# Patient Record
Sex: Male | Born: 1940
Health system: Southern US, Community
[De-identification: ages and names within clinical notes are randomized; demographics above are authoritative.]

## PROBLEM LIST (undated history)

## (undated) DIAGNOSIS — T7840XA Allergy, unspecified, initial encounter: Secondary | ICD-10-CM

## (undated) DIAGNOSIS — J45909 Unspecified asthma, uncomplicated: Secondary | ICD-10-CM

## (undated) HISTORY — PX: TONSILLECTOMY: SUR1361

## (undated) HISTORY — PX: HERNIA REPAIR: SHX51

## (undated) HISTORY — DX: Unspecified asthma, uncomplicated: J45.909

## (undated) HISTORY — DX: Allergy, unspecified, initial encounter: T78.40XA

## (undated) HISTORY — PX: DUPUYTREN / PALMAR FASCIOTOMY: SUR601

## (undated) HISTORY — PX: CATARACT EXTRACTION, BILATERAL: SHX1313

## (undated) HISTORY — PX: EYE SURGERY: SHX253

---

## 1987-07-23 HISTORY — PX: LUMBAR DISC SURGERY: SHX700

## 2003-06-09 ENCOUNTER — Encounter: Payer: Self-pay | Admitting: Internal Medicine

## 2005-10-22 ENCOUNTER — Ambulatory Visit: Payer: Self-pay | Admitting: Internal Medicine

## 2006-09-29 ENCOUNTER — Ambulatory Visit: Payer: Self-pay | Admitting: Internal Medicine

## 2006-09-30 ENCOUNTER — Ambulatory Visit: Payer: Self-pay | Admitting: Internal Medicine

## 2006-10-02 ENCOUNTER — Ambulatory Visit: Payer: Self-pay | Admitting: Cardiology

## 2006-10-31 ENCOUNTER — Ambulatory Visit: Payer: Self-pay

## 2006-11-04 ENCOUNTER — Encounter: Payer: Self-pay | Admitting: Internal Medicine

## 2006-11-28 ENCOUNTER — Ambulatory Visit: Payer: Self-pay | Admitting: Internal Medicine

## 2006-11-28 LAB — CONVERTED CEMR LAB
BUN: 16 mg/dL (ref 6–23)
Chloride: 107 meq/L (ref 96–112)
Eosinophils Absolute: 0.5 10*3/uL (ref 0.0–0.6)
Eosinophils Relative: 6.2 % — ABNORMAL HIGH (ref 0.0–5.0)
Hemoglobin: 15.1 g/dL (ref 13.0–17.0)
INR: 1 (ref 0.9–2.0)
MCV: 94 fL (ref 78.0–100.0)
Monocytes Relative: 11.9 % — ABNORMAL HIGH (ref 3.0–11.0)
Neutro Abs: 4.8 10*3/uL (ref 1.4–7.7)
Neutrophils Relative %: 61.8 % (ref 43.0–77.0)
Potassium: 4.4 meq/L (ref 3.5–5.1)
Sodium: 141 meq/L (ref 135–145)
WBC: 7.7 10*3/uL (ref 4.5–10.5)
aPTT: 23.8 s — ABNORMAL LOW (ref 26.5–36.5)

## 2006-12-02 ENCOUNTER — Ambulatory Visit: Payer: Self-pay

## 2006-12-02 ENCOUNTER — Encounter: Payer: Self-pay | Admitting: Cardiology

## 2006-12-03 ENCOUNTER — Ambulatory Visit (HOSPITAL_COMMUNITY): Admission: RE | Admit: 2006-12-03 | Discharge: 2006-12-03 | Payer: Self-pay | Admitting: Internal Medicine

## 2006-12-03 ENCOUNTER — Ambulatory Visit: Payer: Self-pay | Admitting: Internal Medicine

## 2008-03-17 ENCOUNTER — Ambulatory Visit: Payer: Self-pay | Admitting: Internal Medicine

## 2008-03-17 DIAGNOSIS — H900 Conductive hearing loss, bilateral: Secondary | ICD-10-CM

## 2008-03-17 DIAGNOSIS — R55 Syncope and collapse: Secondary | ICD-10-CM | POA: Insufficient documentation

## 2008-03-22 ENCOUNTER — Encounter (INDEPENDENT_AMBULATORY_CARE_PROVIDER_SITE_OTHER): Payer: Self-pay | Admitting: *Deleted

## 2008-03-24 ENCOUNTER — Encounter (INDEPENDENT_AMBULATORY_CARE_PROVIDER_SITE_OTHER): Payer: Self-pay | Admitting: *Deleted

## 2008-03-30 ENCOUNTER — Telehealth: Payer: Self-pay | Admitting: Internal Medicine

## 2008-04-01 ENCOUNTER — Ambulatory Visit: Payer: Self-pay | Admitting: Internal Medicine

## 2008-04-01 LAB — CONVERTED CEMR LAB: PSA: 1.24 ng/mL (ref 0.10–4.00)

## 2008-04-04 ENCOUNTER — Encounter (INDEPENDENT_AMBULATORY_CARE_PROVIDER_SITE_OTHER): Payer: Self-pay | Admitting: *Deleted

## 2008-05-25 ENCOUNTER — Ambulatory Visit: Payer: Self-pay | Admitting: Internal Medicine

## 2008-09-27 ENCOUNTER — Ambulatory Visit: Payer: Self-pay | Admitting: Internal Medicine

## 2008-09-28 ENCOUNTER — Encounter (INDEPENDENT_AMBULATORY_CARE_PROVIDER_SITE_OTHER): Payer: Self-pay | Admitting: *Deleted

## 2008-09-29 ENCOUNTER — Encounter: Payer: Self-pay | Admitting: Internal Medicine

## 2008-10-03 ENCOUNTER — Ambulatory Visit: Payer: Self-pay | Admitting: Internal Medicine

## 2008-10-03 DIAGNOSIS — R7309 Other abnormal glucose: Secondary | ICD-10-CM

## 2008-10-03 LAB — CONVERTED CEMR LAB: LDL Goal: 160 mg/dL
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
## 2009-05-22 ENCOUNTER — Ambulatory Visit: Payer: Self-pay | Admitting: Internal Medicine

## 2010-06-13 ENCOUNTER — Encounter: Payer: Self-pay | Admitting: Internal Medicine

## 2010-06-21 ENCOUNTER — Ambulatory Visit: Payer: Self-pay | Admitting: Internal Medicine

## 2010-06-21 DIAGNOSIS — J45909 Unspecified asthma, uncomplicated: Secondary | ICD-10-CM | POA: Insufficient documentation

## 2010-06-21 DIAGNOSIS — J309 Allergic rhinitis, unspecified: Secondary | ICD-10-CM | POA: Insufficient documentation

## 2010-06-25 LAB — CONVERTED CEMR LAB
Eosinophils Absolute: 0 10*3/uL (ref 0.0–0.7)
Eosinophils Relative: 0.2 % (ref 0.0–5.0)
Hgb A1c MFr Bld: 6.1 % (ref 4.6–6.5)
Lymphocytes Relative: 10.6 % — ABNORMAL LOW (ref 12.0–46.0)
Lymphs Abs: 1.3 10*3/uL (ref 0.7–4.0)
MCHC: 33.8 g/dL (ref 30.0–36.0)
Neutro Abs: 9.2 10*3/uL — ABNORMAL HIGH (ref 1.4–7.7)
Platelets: 383 10*3/uL (ref 150.0–400.0)

## 2010-07-19 ENCOUNTER — Ambulatory Visit: Payer: Self-pay | Admitting: Internal Medicine

## 2010-08-19 LAB — CONVERTED CEMR LAB
ALT: 31 units/L (ref 0–53)
Albumin: 4.3 g/dL (ref 3.5–5.2)
Alkaline Phosphatase: 63 units/L (ref 39–117)
BUN: 15 mg/dL (ref 6–23)
Basophils Absolute: 0 10*3/uL (ref 0.0–0.1)
CRP, High Sensitivity: <1 — ABNORMAL LOW (ref 0.00–5.00)
Cholesterol: 247 mg/dL (ref 0–200)
Creatinine, Ser: 0.9 mg/dL (ref 0.4–1.5)
Direct LDL: 139 mg/dL
Eosinophils Absolute: 0.4 10*3/uL (ref 0.0–0.7)
Glucose, Bld: 111 mg/dL — ABNORMAL HIGH (ref 70–99)
HCT: 47.2 % (ref 39.0–52.0)
HDL: 74.6 mg/dL (ref >39.0)
Hemoglobin: 15.9 g/dL (ref 13.0–17.0)
Lymphocytes Relative: 23.5 % (ref 12.0–46.0)
MCV: 99.1 fL (ref 78.0–100.0)
Monocytes Relative: 12.3 % — ABNORMAL HIGH (ref 3.0–12.0)
Neutro Abs: 3.8 10*3/uL (ref 1.4–7.7)
Neutrophils Relative %: 57.2 % (ref 43.0–77.0)
Platelets: 316 10*3/uL (ref 150–400)
Potassium: 5.1 meq/L (ref 3.5–5.1)
RBC: 4.76 M/uL (ref 4.22–5.81)
TSH: 1.01 microintl units/mL (ref 0.35–5.50)
Total Protein: 7.1 g/dL (ref 6.0–8.3)
WBC: 6.5 10*3/uL (ref 4.5–10.5)

## 2010-08-21 NOTE — Assessment & Plan Note (Signed)
Summary: TO TAKE ABOUT ASTHMA//PH   Vital Signs:  Patient profile:   70 year old male Weight:      204.6 pounds BMI:     27.85 Temp:     98.7 degrees F oral Pulse rate:   68 / minute Resp:     16 per minute BP sitting:   104 / 62  (left arm) Cuff size:   large  Vitals Entered By: Shonna Chock CMA (June 21, 2010 8:08 AM) CC: Follow-up visit: patient was seen at urgent care for allergy/asthma, COPD follow-up   CC:  Follow-up visit: patient was seen at urgent care for allergy/asthma and COPD follow-up.  History of Present Illness: RAD  Follow-Up      This is a 70 year old man who presents for  RAD  follow-up for which he was treated @ UC in [facility], [place] 06/13/2010. O2 sats were 94% on RA He received Prednisone, Ventolin MDI & antihistamine with significant improvement in extrinsic symptoms of SOB, wheezing, itchy eyes & sneezing. No PMH of asthma .Occasional PMH of seasonal allergic. He smoked from age 43 -28, < 1 ppd.  The patient  now denies shortness of breath, chest tightness, wheezing, cough, increased sputum, nocturnal awakening, and exercise induced symptoms.  The patient reports no limitation in activities.  Current treatment includes MDI without spacer twice a day .  He is on 8th of 10 days of Prednisone. No PMH of PNA. His  Pneumonia  vaccine & flu shot are up to date.  Current Medications (verified): 1)  Bayer Aspirin 325 Mg Tabs (Aspirin) .Marland Kitchen.. 1 By Mouth Once Daily With Breakfast 2)  Multivitamins  Tabs (Multiple Vitamin) .Marland Kitchen.. 1 By Mouth Once Daily 3)  Fiber .Marland Kitchen.. 1 By Mouth Once Daily 4)  Prednisone (Pak) 10 Mg Tabs (Prednisone) .... As Directed Per Pak (Rx'ed At Urgent Care) 5)  Ventolin Hfa 108 (90 Base) Mcg/act Aers (Albuterol Sulfate) .... As Needed (Rx'ed At Urgent Care) 6)  Diphenhydramine Hcl 25 Mg Tabs (Diphenhydramine Hcl) .Marland Kitchen.. 1 By Mouth Once Daily  Allergies (verified): No Known Drug Allergies  Past History:  Past Medical History: Syncope  09/28/06 Macular Degeneration , Dr Dione Booze Allergic rhinitis, seasonal  Physical Exam  General:  in no acute distress; alert,appropriate and cooperative throughout examination Ears:  External ear exam shows no significant lesions or deformities.  Otoscopic examination reveals clear canals, tympanic membranes are intact bilaterally without bulging, retraction, inflammation or discharge. Hearing is grossly normal bilaterally. Nose:  External nasal examination shows no deformity or inflammation. Nasal mucosa are pink and moist without lesions or exudates. Mouth:  Oral mucosa and oropharynx without lesions or exudates.  Teeth in good repair. Lungs:  Normal respiratory effort, chest expands symmetrically. Lungs are clear to auscultation, no crackles or wheezes. BS slightly decreased Heart:  regular rhythm, no gallop, no rub, no JVD, bradycardia, and grade 1 /6 systolic murmur.   Pulses:  R and L radial,dorsalis pedis and posterior tibial pulses are full and equal bilaterally Extremities:  No clubbing, cyanosis, edema. Neurologic:  alert & oriented X3.   Skin:  Intact without suspicious lesions or rashes Cervical Nodes:  No lymphadenopathy noted Axillary Nodes:  No palpable lymphadenopathy Psych:  normally interactive.     Impression & Recommendations:  Problem # 1:  REACTIVE AIRWAY DISEASE (ICD-493.90)  His updated medication list for this problem includes:    Prednisone (pak) 10 Mg Tabs (Prednisone) .Marland Kitchen... As directed per pak (rx'ed at urgent care)  Ventolin Hfa 108 (90 Base) Mcg/act Aers (Albuterol sulfate) .Marland Kitchen... 1-2 puffs every 4 hrs prn    Dulera 200-5 Mcg/act Aero (Mometasone furo-formoterol fum) .Marland Kitchen... 1-2 puffs every 12 hrs as needed  Orders: Venipuncture (60454) TLB-CBC Platelet - w/Differential (85025-CBCD)  Problem # 2:  ALLERGIC RHINITIS (ICD-477.9)  Orders: Venipuncture (09811) TLB-CBC Platelet - w/Differential (85025-CBCD)  His updated medication list for this problem  includes:    Loratadine 10 Mg Tabs (Loratadine) .Marland Kitchen... 1 once daily as needed for allergies  Problem # 3:  FASTING HYPERGLYCEMIA (ICD-790.29)  PMH of , on  steroid burst  Orders: Venipuncture (91478) TLB-A1C / Hgb A1C (Glycohemoglobin) (83036-A1C)  Complete Medication List: 1)  Bayer Aspirin 325 Mg Tabs (Aspirin) .Marland Kitchen.. 1 by mouth once daily with breakfast 2)  Multivitamins Tabs (Multiple vitamin) .Marland Kitchen.. 1 by mouth once daily 3)  Fiber  .Marland Kitchen.. 1 by mouth once daily 4)  Prednisone (pak) 10 Mg Tabs (Prednisone) .... As directed per pak (rx'ed at urgent care) 5)  Ventolin Hfa 108 (90 Base) Mcg/act Aers (Albuterol sulfate) .Marland Kitchen.. 1-2 puffs every 4 hrs prn 6)  Dulera 200-5 Mcg/act Aero (Mometasone furo-formoterol fum) .Marland Kitchen.. 1-2 puffs every 12 hrs as needed 7)  Loratadine 10 Mg Tabs (Loratadine) .Marland Kitchen.. 1 once daily as needed for allergies  Patient Instructions: 1)  Use Ventolin every 4 hrs as needed only . 2)  Please schedule a follow-up appointment in 1 month.We may consider further evaluation. Long term Singulair may be option for extrinsic cough. Prescriptions: LORATADINE 10 MG TABS (LORATADINE) 1 once daily as needed for allergies  #90 x 3   Entered and Authorized by:   Marga Melnick MD   Signed by:   Marga Melnick MD on 06/21/2010   Method used:   Print then Give to Patient   RxID:   [id number] DULERA 200-5 MCG/ACT AERO (MOMETASONE FURO-FORMOTEROL FUM) 1-2 puffs every 12 hrs as needed  #1 x 5   Entered and Authorized by:   Marga Melnick MD   Signed by:   Marga Melnick MD on 06/21/2010   Method used:   Print then Give to Patient   RxID:   [id number]    Orders Added: 1)  Est. Patient Level IV [72536] 2)  Venipuncture [64403] 3)  TLB-CBC Platelet - w/Differential [85025-CBCD] 4)  TLB-A1C / Hgb A1C (Glycohemoglobin) [83036-A1C]  Appended Document: TO TAKE ABOUT ASTHMA//PH

## 2010-08-21 NOTE — Progress Notes (Signed)
Summary: Med & Problem List Brought by Patient  Med & Problem List Brought by Patient   Imported By: Lanelle Bal 06/27/2010 13:05:04  _____________________________________________________________________  External Attachment:    Type:   Image     Comment:   External Document

## 2010-08-23 NOTE — Assessment & Plan Note (Signed)
Summary: rto 1 month/cbs   Vital Signs:  Patient profile:   71 year old male Weight:      206 pounds BMI:     28.04 Temp:     98.6 degrees F oral Pulse rate:   72 / minute Resp:     15 per minute BP sitting:   110 / 68  (left arm) Cuff size:   large  Vitals Entered By: Shonna Chock CMA (July 19, 2010 9:15 AM) CC: 1 month follow-up (patient states he feels better compared to 1 month ago) , COPD follow-up   CC:  1 month follow-up (patient states he feels better compared to 1 month ago)  and COPD follow-up.  History of Present Illness:      This is a 70 year old man who presents for  RAD  follow-up.  The patient reports  minor loose cough, but denies shortness of breath, chest tightness, wheezing, increased sputum, nocturnal awakening, and exercise induced symptoms.  The patient reports no limitation in activities.  Medication use includes controller med rarely, Dulera 2x/week.  Symptoms are worse with rhinitis. Dog hair may be trigger.    Current Medications (verified): 1)  Bayer Aspirin 325 Mg Tabs (Aspirin) .Marland Kitchen.. 1 By Mouth Once Daily With Breakfast 2)  Multivitamins  Tabs (Multiple Vitamin) .Marland Kitchen.. 1 By Mouth Once Daily 3)  Fiber .Marland Kitchen.. 1 By Mouth Once Daily 4)  Prednisone (Pak) 10 Mg Tabs (Prednisone) .... As Directed Per Pak (Rx'ed At Urgent Care) 5)  Ventolin Hfa 108 (90 Base) Mcg/act Aers (Albuterol Sulfate) .Marland Kitchen.. 1-2 Puffs Every 4 Hrs Prn 6)  Dulera 200-5 Mcg/act Aero (Mometasone Furo-Formoterol Fum) .Marland Kitchen.. 1-2 Puffs Every 12 Hrs As Needed 7)  Loratadine 10 Mg Tabs (Loratadine) .Marland Kitchen.. 1 Once Daily As Needed For Allergies  Allergies (verified): No Known Drug Allergies  Review of Systems Resp:  Denies chest pain with inspiration. Allergy:  Denies itching eyes and sneezing.  Physical Exam  General:  in no acute distress; alert,appropriate and cooperative throughout examination Ears:  External ear exam shows no significant lesions or deformities.  Otoscopic examination reveals  clear canals, tympanic membranes are intact bilaterally without bulging, retraction, inflammation or discharge. Hearing is grossly normal bilaterally. Nose:  External nasal examination shows no deformity or inflammation. Nasal mucosa are pink and moist without lesions or exudates. Mouth:  Oral mucosa and oropharynx without lesions or exudates.   Lungs:  Normal respiratory effort, chest expands symmetrically. Lungs are clear to auscultation, no crackles or wheezes. BS slightly decreased Heart:  Normal rate and regular rhythm. S1 and S2 normal without gallop, murmur, click, rub .S4 Cervical Nodes:  No lymphadenopathy noted Axillary Nodes:  No palpable lymphadenopathy   Impression & Recommendations:  Problem # 1:  REACTIVE AIRWAY DISEASE (ICD-493.90) minimal residual  NP  cough His updated medication list for this problem includes:    Prednisone (pak) 10 Mg Tabs (Prednisone) .Marland Kitchen... As directed per pak (rx'ed at urgent care)    Ventolin Hfa 108 (90 Base) Mcg/act Aers (Albuterol sulfate) .Marland Kitchen... 1-2 puffs every 4 hrs prn    Dulera 200-5 Mcg/act Aero (Mometasone furo-formoterol fum) .Marland Kitchen... 1-2 puffs every 12 hrs as needed    Singulair 10 Mg Tabs (Montelukast sodium) .Marland Kitchen... 1 once daily as needed for allergic cough  Problem # 2:  ALLERGIC RHINITIS (ICD-477.9) controlled with Loratidine His updated medication list for this problem includes:    Loratadine 10 Mg Tabs (Loratadine) .Marland Kitchen... 1 once daily as needed for allergies  Complete  Medication List: 1)  Bayer Aspirin 325 Mg Tabs (Aspirin) .Marland Kitchen.. 1 by mouth once daily with breakfast 2)  Multivitamins Tabs (Multiple vitamin) .Marland Kitchen.. 1 by mouth once daily 3)  Fiber  .Marland Kitchen.. 1 by mouth once daily 4)  Prednisone (pak) 10 Mg Tabs (Prednisone) .... As directed per pak (rx'ed at urgent care) 5)  Ventolin Hfa 108 (90 Base) Mcg/act Aers (Albuterol sulfate) .Marland Kitchen.. 1-2 puffs every 4 hrs prn 6)  Dulera 200-5 Mcg/act Aero (Mometasone furo-formoterol fum) .Marland Kitchen.. 1-2 puffs every  12 hrs as needed 7)  Loratadine 10 Mg Tabs (Loratadine) .Marland Kitchen.. 1 once daily as needed for allergies 8)  Singulair 10 Mg Tabs (Montelukast sodium) .Marland Kitchen.. 1 once daily as needed for allergic cough  Patient Instructions: 1)  Singulair once daily as needed for cough unresponsive to Loratidine. Prescriptions: SINGULAIR 10 MG TABS (MONTELUKAST SODIUM) 1 once daily as needed for allergic cough  #30 x 5   Entered and Authorized by:   Marga Melnick MD   Signed by:   Marga Melnick MD on 07/19/2010   Method used:   Print then Give to Patient   RxID:   [id number]    Orders Added: 1)  Est. Patient Level III [14782]
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
## 2010-12-04 NOTE — Op Note (Signed)
NAME:  Jeremy Li, Jeremy Li                 ACCOUNT NO.:  [id number]   MEDICAL RECORD NO.:  [id number]          PATIENT TYPE:  OIB   LOCATION:  2855                         FACILITY:  [facility]   PHYSICIAN:  Doylene Canning. Ladona Ridgel, MD    DATE OF BIRTH:  September 22, 1940   DATE OF PROCEDURE:  12/03/2006  DATE OF DISCHARGE:  12/03/2006                               OPERATIVE REPORT   PROCEDURE PERFORMED:  Head-up tilt table test.   INDICATIONS:  Unexplained syncope.   INTRODUCTION:  The patient is a very pleasant 70 year old male with  unexplained syncope and PVCs who is referred for head-up tilt table  test.   PROCEDURE:  After informed obtained, the patient taken diagnostic EP lab  in fasting state.  Usual preparation was placed in the supine position  where his initial heart rate was in the mid 50s in his blood pressure  was 130/75.  He was placed in the 70 degree head up tilt table position  for the next 45 minutes.  There during this time period, the heart rate  remained in the low to mid 60s.  His maximum heart rate increased up to  78 beats per minute.  His blood pressure initially was in the 140 range  and over the next 45 minutes gradually decreased down to the 110 range  but with a nadir of 102 mmHg.  Despite this the patient had no inducible  syncope and no symptoms.  He was subsequently returned back to the  supine position and then back to his room in satisfactory condition.   COMPLICATIONS:  There were no immediate procedure complications.   RESULTS:  Demonstrates a negative head-up tilt table testing a patient  with unexplained syncope.      Doylene Canning. Ladona Ridgel, MD  Electronically Signed     GWT/MEDQ  D:  04/16/2007  T:  04/17/2007  Job:  [id number]
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
## 2011-03-22 ENCOUNTER — Ambulatory Visit (HOSPITAL_BASED_OUTPATIENT_CLINIC_OR_DEPARTMENT_OTHER)
Admission: RE | Admit: 2011-03-22 | Discharge: 2011-03-22 | Disposition: A | Discharge status: Home / Self Care | Payer: Medicare Other | Source: Ambulatory Visit | Attending: Orthopedic Surgery | Admitting: Orthopedic Surgery

## 2011-03-22 DIAGNOSIS — M6580 Other synovitis and tenosynovitis, unspecified site: Secondary | ICD-10-CM | POA: Insufficient documentation

## 2011-03-22 DIAGNOSIS — L923 Foreign body granuloma of the skin and subcutaneous tissue: Secondary | ICD-10-CM | POA: Insufficient documentation

## 2011-03-26 NOTE — H&P (Signed)
NAMETIANT, PEIXOTO                 ACCOUNT NO.:  [id number]  MEDICAL RECORD NO.:  [id number]  LOCATION:                                 FACILITY:  PHYSICIAN:  Katy Fitch. Priti Consoli, M.D. DATE OF BIRTH:  1941/03/31  DATE OF ADMISSION: DATE OF DISCHARGE:                             HISTORY & PHYSICAL   PREOPERATIVE DIAGNOSES: 1. Chronic stenosing tenosynovitis, left long finger at A1 pulley. 2. Retained thorn foreign body with development of granuloma, dorsal     aspect of left small finger, metacarpophalangeal joint, rule out     intra-articular placement of thorn.  POSTOPERATIVE DIAGNOSIS:  Chronic left long finger stenosing tenosynovitis and intra-articular thorn with foreign body granuloma.  OPERATION: 1. Release of left long finger A1 pulley and A0 pulley. 2. Resection of thorn foreign body granuloma with debridement of the     granulation tissue from metacarpophalangeal joint capsule.  OPERATING SURGEON:  Katy Fitch. Cynithia Hakimi, MD  ASSISTANT:  Marveen Reeks Dasnoit, PA-C  ANESTHESIA:  Lidocaine 2% palmar block of left long finger and flexor sheath and dorsal field block, left small finger metacarpophalangeal joint.  ANESTHETIST:  Katy Fitch. Agata Lucente, MD  This was a minor operating room procedure.  PROCEDURE:  Renee Beale was brought to room 1 of the [facility] and placed in supine position on the operating table.  Following informed consent and an alcohol and Betadine prep, 2% lidocaine was infiltrated in the path of the intended incisions in the palm, the flexor sheath, the left long finger and proximal to the metacarpal head of the left small finger.  The left arm and hand were then prepped with Betadine soap and solution and sterilely draped.  A pneumatic tourniquet was applied to proximal left brachium.  Following exsanguination of left arm by direct compression, the arterial tourniquet was inflated to 220 mmHg.  The procedure commenced with an oblique incision  just distal to the distal palmar crease.  Subcutaneous tissues were carefully divided releasing contracted palmar fascia.  The A1 pulley was identified and split with scalpel and scissors.  Mr. Woldt had some palmar fibromatosis and a tight A0 pulley.  This was likewise dissected and released.  Thereafter, he demonstrated full active range of motion of his left long finger without triggering.  This wound was then repaired with intradermal 3-0 Prolene suture.  Attention was then directed to the dorsal aspect of the left small finger MP joint region.  A short longitudinal incision was fashioned over the palpably enlarged area.  Subcutaneous tissues were carefully divided revealing a granuloma pointing through the extensor mechanism. This was incised longitudinally and, with gentle spreading, a 3/8-inch long thorn was removed.  We carefully curetted the granuloma.  No other thorn parts were identified.  We could not see signs of other foreign body.  However, due to the articular nature of this injury, it is possible that Mr. Bays has a small fragment of thorn in the palmar aspect of the joint.  Should he have a retained fragment, he would have continued synovitis.  The wound was then curetted, followed by repair of the skin with intradermal 3-0 Prolene.  For aftercare, Mr. Thoma  is placed on Vicodin 5 mg 1 p.o. q.4-6 hours p.r.n. pain, 20 tablets without refill.  Also, doxycycline 100 mg p.o. b.i.d. x4 days as prophylactic antibiotic.  He will return to see [place] in followup in a week for suture removal.  He will contact us if he has any problems with his wounds.  I have told him to remove the dressing placed in the OR in 3 days and advance to Band- Aids on both wounds.  He will contact our office sooner p.r.n. problems.     Katy Fitch Josselin Gaulin, M.D.     RVS/MEDQ  D:  03/22/2011  T:  03/22/2011  Job:  [id number]  cc:   Titus Dubin. Alwyn Ren, MD,FACP,FCCP  Electronically Signed by  Josephine Igo M.D. on 03/26/2011 09:55:07 AM

## 2012-07-27 ENCOUNTER — Ambulatory Visit (INDEPENDENT_AMBULATORY_CARE_PROVIDER_SITE_OTHER): Payer: Medicare Other | Admitting: Internal Medicine

## 2012-07-27 ENCOUNTER — Encounter: Payer: Self-pay | Admitting: Internal Medicine

## 2012-07-27 VITALS — BP 124/76 | HR 88 | Temp 98.7°F | Wt 203.0 lb

## 2012-07-27 DIAGNOSIS — J309 Allergic rhinitis, unspecified: Secondary | ICD-10-CM

## 2012-07-27 DIAGNOSIS — R55 Syncope and collapse: Secondary | ICD-10-CM

## 2012-07-27 DIAGNOSIS — J45909 Unspecified asthma, uncomplicated: Secondary | ICD-10-CM

## 2012-07-27 MED ORDER — ALBUTEROL SULFATE HFA 108 (90 BASE) MCG/ACT IN AERS: 2.0000 | INHALATION_SPRAY | RESPIRATORY_TRACT | Status: DC | PRN

## 2012-07-27 MED ORDER — FLUTICASONE PROPIONATE 50 MCG/ACT NA SUSP: 1.0000 | Freq: Two times a day (BID) | NASAL | Status: DC | PRN

## 2012-07-27 NOTE — Progress Notes (Signed)
  Subjective:    Patient ID: Jeremy Li, male    DOB: [birth year], 72 y.o.   MRN: [id number]  HPI  07/15/12 he experienced acute onset of dyspnea while essentially sedentary; this was in the context of a family visit to [place] with exposure to dogs. The events were similar to those 07/15/10.  The present symptoms associated with cough with white sputum.  He also had itchy, watery eyes and sneezing. Symptoms improved with a Benadryl equivalent and use of leftover albuterol. He is back to his baseline.   Review of Systems He denies associated fever, chills, or sweats. He's had no purulent sputum. He also denies edema, claudication, palpitations, chest pain, or  paroxysmal nocturnal dyspnea.  He has a history of right no conjunctivitis related to seasonal allergies as well as remotely with Exposure. He has no history of asthma and is no family history of pulmonary disease.  His smoking history is minimal.     Objective:   Physical Exam General appearance:good health ;well nourished; no acute distress or increased work of breathing is present.  No  lymphadenopathy about the head, neck, or axilla noted.   Eyes: No conjunctival inflammation or lid edema is present.   Ears:  External ear exam shows no significant lesions or deformities.  Otoscopic examination reveals clear canals, tympanic membranes are intact bilaterally without bulging, retraction, inflammation or discharge.  Nose:  External nasal examination shows no deformity or inflammation. Nasal mucosa are pink and moist without lesions or exudates. No septal dislocation or deviation.No obstruction to airflow.   Oral exam: Dental hygiene is good; lips and gums are healthy appearing.There is no oropharyngeal erythema or exudate noted.   Neck:  No deformities, thyromegaly, masses, or tenderness noted.   No NVD Heart:  Normal rate and regular rhythm. S1 and S2 normal without gallop, murmur, click, rub .S4 Lungs:Chest clear to  auscultation; no wheezes, rhonchi,rales ,or rubs present.No increased work of breathing.  Decreased BS  Bowel sounds are normal. Abdomen is soft and nontender with no organomegaly, hernias  or masses. No HJR  Extremities:  No cyanosis, edema, or clubbing  noted . Minor DIP changes   All pulses intact without  bruits .No ischemic skin changes.     Skin: Warm & dry           Assessment & Plan:

## 2012-07-27 NOTE — Assessment & Plan Note (Signed)
Albuterol 1-2 sprays every 4-6 hours as needed. If the rescue agent is needed more than 2-3 times per week; maintenance agent such as Dulera would be appropriate

## 2012-07-27 NOTE — Assessment & Plan Note (Signed)
Fluticasone 1 spray in each nostril twice a day as needed. Use the "crossover" technique as discussed

## 2012-07-27 NOTE — Patient Instructions (Addendum)
Plain Mucinex for thick secretions ;force NON dairy fluids . Use a Neti pot daily as needed for sinus congestion; going from open side to congested side . Nasal cleansing in the shower as discussed. Make sure that all residual soap is removed to prevent irritation. Fluticasone 1 spray in each nostril twice a day as needed. Use the "crossover" technique as discussed. Plain Allegra 160 daily or Loratidine 10 mg as needed for itchy eyes & sneezing.  If you activate My Chart; the results can be released to you as soon as they populate from the lab. If you choose not to use this program; the labs have to be reviewed, copied & mailed   causing a delay in getting the results to you.

## 2012-09-28 ENCOUNTER — Ambulatory Visit (INDEPENDENT_AMBULATORY_CARE_PROVIDER_SITE_OTHER): Payer: Medicare Other | Admitting: Internal Medicine

## 2012-09-28 ENCOUNTER — Encounter: Payer: Self-pay | Admitting: Internal Medicine

## 2012-09-28 VITALS — BP 124/78 | HR 78 | Temp 98.6°F | Resp 12 | Ht 72.5 in | Wt 204.0 lb

## 2012-09-28 DIAGNOSIS — J45909 Unspecified asthma, uncomplicated: Secondary | ICD-10-CM

## 2012-09-28 DIAGNOSIS — R7309 Other abnormal glucose: Secondary | ICD-10-CM

## 2012-09-28 DIAGNOSIS — J309 Allergic rhinitis, unspecified: Secondary | ICD-10-CM

## 2012-09-28 DIAGNOSIS — E785 Hyperlipidemia, unspecified: Secondary | ICD-10-CM

## 2012-09-28 DIAGNOSIS — Z0000 Encounter for general adult medical examination without abnormal findings: Secondary | ICD-10-CM

## 2012-09-28 NOTE — Patient Instructions (Addendum)
Preventive Health Care: Exercise at least 30-45 minutes a day,  3-4 days a week.  Eat a low-fat diet with lots of fruits and vegetables, up to 7-9 servings per day. This would eliminate the need for vitamin supplements. Consume less than 40 grams of sugar (preferably ZERO) per day from foods & drinks with High Fructose Corn Sugar as #1,2,3 or # 4 on label. Please  schedule fasting Labs : BMET,Lipids, hepatic panel,  TSH, A1c.PLEASE BRING THESE INSTRUCTIONS TO FOLLOW UP  LAB APPOINTMENT.This will guarantee correct labs are drawn, eliminating need for repeat blood sampling ( needle sticks ! ). Diagnoses /Codes: 272.4, 790.29.

## 2012-09-28 NOTE — Progress Notes (Signed)
Subjective:    Patient ID: Jeremy Li, male    DOB: 1940-08-20, 72 y.o.   MRN: [id number]  HPI Medicare Wellness Visit:  Psychosocial & medical history were reviewed as required by Medicare (abuse,antisocial behavioral risks,firearm risk).  Social history: caffeine: 2 cups coffee/day , alcohol: 2 small glasses wine/day  ,  tobacco use: quit 2008   Exercise : walking 1/2 mpd No home & personal  safety / fall risk Activities of daily living: no limitations  Seatbelt  and smoke alarm employed. Power of Attorney/Living Will status : in place Ophthalmology exam current (Dr Christophe Louis) Hearing evaluation current Orientation :oriented X 3  Memory & recall :good Spelling  testing:good Mood & affect : normal . Depression / anxiety: denied Travel history : last [place] 1981 Immunization status :Shingles  needed Transfusion history:  none  Preventive health surveillance ( colonoscopy as per protocol/ [facility]): see below Dental care:  Every 12 mos. Chart reviewed &  Updated. Active issues reviewed & addressed.      Review of Systems HYPERLIPIDEMIA: Diet- heart healthy Chest pain, palpitations- no       Dyspnea- only with extrinsic RAD flare Lightheadedness,Syncope- no Edema- no Muscle aches- no   PMH of HYPERGLYCEMIA: Polyuria/phagia/dipsia- no      Visual problems- no   Abd pain, bowel changes- no but no colonoscopy to date;because he is worried about risk of procedureSOC discussed               Objective:   Physical Exam Gen.:  well-nourished in appearance. Alert, appropriate and cooperative throughout exam. Head: Normocephalic without obvious abnormalities; pattern alopecia  Eyes: No corneal or conjunctival inflammation noted. Ears: External  ear exam reveals no significant lesions or deformities. Canals clear .TMs normal. Hearing is grossly normal bilaterally. Nose: External nasal exam reveals no deformity or inflammation. Nasal mucosa are pink and moist. No lesions  or exudates noted.   Mouth: Oral mucosa and oropharynx reveal no lesions or exudates. Teeth in good repair. Neck: No deformities, masses, or tenderness noted. Range of motion & Thyroid normal. Lungs: Normal respiratory effort; chest expands symmetrically. Lungs are clear to auscultation without rales, wheezes, or increased work of breathing. Heart: Normal rate and rhythm. Normal S1 and S2. No gallop, click, or rub. Grade 1/2 over 6 systolic murmur. Abdomen: Bowel sounds normal; abdomen soft and nontender. No masses or organomegaly . Ventral & umbilical  hernia noted. Genitalia: DRE declined                            Musculoskeletal/extremities: There is some asymmetry of the posterior thoracic musculature suggesting occult scoliosis. No clubbing, cyanosis, edema, or significant extremity  deformity noted. Range of motion normal .Tone & strength  Normal. Joints reveal mild flexion 5th finger changes. Nail health good.Residual Dupuytren's changes. Able to lie down & sit up w/o help. Negative SLR bilaterally. Crepitus L knee w/o effusion. Vascular: Carotid, radial artery, dorsalis pedis and  posterior tibial pulses are full and equal. No bruits present. Neurologic: Alert and oriented x3. Deep tendon reflexes symmetrical and normal.      Skin: Intact without suspicious lesions or rashes. Lymph: No cervical, axillary, or inguinal lymphadenopathy present. Psych: Mood and affect are normal. Normally interactive  Assessment & Plan:  #1 Medicare Wellness Exam; criteria met ; data entered #2 Problem List reviewed ; Assessment/ Recommendations made Plan: see Orders

## 2012-09-30 ENCOUNTER — Other Ambulatory Visit (INDEPENDENT_AMBULATORY_CARE_PROVIDER_SITE_OTHER): Payer: Medicare Other

## 2012-09-30 DIAGNOSIS — E785 Hyperlipidemia, unspecified: Secondary | ICD-10-CM

## 2012-09-30 LAB — BASIC METABOLIC PANEL
BUN: 16 mg/dL (ref 6–23)
Creatinine, Ser: 0.9 mg/dL (ref 0.4–1.5)
GFR: 92.54 mL/min (ref >60.00)
Potassium: 4.1 mEq/L (ref 3.5–5.1)

## 2012-09-30 LAB — LIPID PANEL
Cholesterol: 194 mg/dL (ref 0–200)
Triglycerides: 57 mg/dL (ref 0.0–149.0)

## 2012-09-30 LAB — HEPATIC FUNCTION PANEL
ALT: 21 U/L (ref 0–53)
Alkaline Phosphatase: 57 U/L (ref 39–117)
Bilirubin, Direct: 0 mg/dL (ref 0.0–0.3)
Total Protein: 6.6 g/dL (ref 6.0–8.3)

## 2012-11-13 ENCOUNTER — Other Ambulatory Visit: Payer: Self-pay | Admitting: Internal Medicine

## 2012-11-13 NOTE — Telephone Encounter (Signed)
Med filled.  

## 2012-11-28 ENCOUNTER — Other Ambulatory Visit: Payer: Self-pay | Admitting: Internal Medicine

## 2013-02-24 ENCOUNTER — Other Ambulatory Visit: Payer: Self-pay

## 2013-03-27 ENCOUNTER — Other Ambulatory Visit: Payer: Self-pay | Admitting: Internal Medicine

## 2013-03-31 NOTE — Telephone Encounter (Signed)
Rx sent to the pharmacy by e-script.//AB/CMA 

## 2013-05-27 ENCOUNTER — Other Ambulatory Visit: Payer: Self-pay

## 2013-08-04 ENCOUNTER — Encounter: Payer: Self-pay | Admitting: Internal Medicine

## 2013-08-16 ENCOUNTER — Other Ambulatory Visit: Payer: Self-pay | Admitting: Internal Medicine

## 2013-08-16 NOTE — Telephone Encounter (Signed)
Fluticasone refilled per protocol. JG//CMA

## 2013-09-01 ENCOUNTER — Other Ambulatory Visit: Payer: Self-pay | Admitting: Internal Medicine

## 2013-09-01 NOTE — Telephone Encounter (Signed)
Rx sent to the pharmacy by e-script.//AB/CMA 

## 2013-11-29 ENCOUNTER — Encounter: Payer: Self-pay | Admitting: Internal Medicine

## 2013-12-17 ENCOUNTER — Other Ambulatory Visit: Payer: Self-pay | Admitting: Internal Medicine

## 2014-02-28 ENCOUNTER — Encounter: Payer: Self-pay | Admitting: Internal Medicine

## 2014-02-28 ENCOUNTER — Ambulatory Visit (INDEPENDENT_AMBULATORY_CARE_PROVIDER_SITE_OTHER): Payer: Medicare Other | Admitting: Internal Medicine

## 2014-02-28 VITALS — BP 128/74 | HR 69 | Temp 98.3°F | Wt 206.5 lb

## 2014-02-28 DIAGNOSIS — J45909 Unspecified asthma, uncomplicated: Secondary | ICD-10-CM

## 2014-02-28 MED ORDER — MONTELUKAST SODIUM 10 MG PO TABS: 10.0000 mg | ORAL_TABLET | Freq: Every day | ORAL | Status: DC

## 2014-02-28 NOTE — Progress Notes (Signed)
Pre visit review using our clinic review tool, if applicable. No additional management support is needed unless otherwise documented below in the visit note. 

## 2014-02-28 NOTE — Patient Instructions (Addendum)
Shingles immunization would be indicated even without past history of chicken pox . It cannot be taken by individuals who are immunocompromised due to HIV or due to active chemotherapy. Please verify with your insurance company  which location would be least cost to you for its administration.

## 2014-02-28 NOTE — Assessment & Plan Note (Signed)
Trial of Singulair 

## 2014-02-28 NOTE — Progress Notes (Signed)
   Subjective:    Patient ID: Jeremy Li, male    DOB: 1941/05/05, 73 y.o.   MRN: [id number]  HPI  He has extrinsic symptoms of itchy, watery eyes if around his dog. He's been using loratadine with good control of the symptoms. He also employs nasal hygiene.  If he actually pets the dog he will develop reactive airways symptoms for which he has to use Ventolin up to 4 X a day    Review of Systems  He is also questioning whether or not to take the shingles vaccine. He is unsure as to whether he's ever had chickenpox.  The literature in Up to Date was reviewed. Whether someone has had chickenpox or not is irrelevant as the history is unreliable according to literature and even with prior chickenpox the immune protection due to antibodies may wane with age.  It's interesting that his wife was part of zoster vaccine study and actually received the vaccine although she had never had chickenpox. She has had no complications      Objective:   Physical Exam General appearance:good health ;well nourished; no acute distress or increased work of breathing is present.  No  lymphadenopathy about the head, neck, or axilla noted.   Eyes: No conjunctival inflammation or lid edema is present. There is no scleral icterus.Ptosis OS > OD  Ears:  External ear exam shows no significant lesions or deformities.  Otoscopic examination reveals clear canals, tympanic membranes are intact bilaterally without bulging, retraction, inflammation or discharge.  Nose:  External nasal examination shows no deformity or inflammation. Nasal mucosa are pink and moist without lesions or exudates. No septal dislocation or deviation.No obstruction to airflow.   Oral exam: lips and gums are healthy appearing.There is no oropharyngeal erythema or exudate noted.   Neck:  No deformities, thyromegaly, masses, or tenderness noted.   Supple with full range of motion without pain.   Heart:  Slow regular  rate and rhythm. S1 and  S2 normal without gallop, murmur, click, rub or other extra sounds.   Lungs:Chest clear to auscultation; no wheezes, rhonchi,rales ,or rubs present.No increased work of breathing.    Extremities:  No cyanosis, edema, or clubbing  noted    Skin: Warm & dry w/o jaundice or tenting.         Assessment & Plan:  See Current Assessment & Plan in Problem List under specific Diagnosis

## 2014-03-04 ENCOUNTER — Encounter: Payer: Self-pay | Admitting: Internal Medicine

## 2014-03-26 ENCOUNTER — Other Ambulatory Visit: Payer: Self-pay | Admitting: Internal Medicine

## 2014-03-29 ENCOUNTER — Encounter: Payer: Self-pay | Admitting: Internal Medicine

## 2014-05-02 ENCOUNTER — Telehealth: Payer: Self-pay | Admitting: Internal Medicine

## 2014-05-02 NOTE — Telephone Encounter (Signed)
Chart has been updated.

## 2014-05-02 NOTE — Telephone Encounter (Signed)
Pt called request for our office to update on his records for the injection that he got.   Dr. Linna Darner, I have done both Flu vaccine and Pneumonia vaccine at [facility], [place], on 04/21/2014. Influenza seasonal high dose PFS, 0.43ml Prevnar 13, 0.4ml Would you arrange to have this added to preventative care chart. I would do this myself, but do not see a method to add changes. Thank you for your help.

## 2014-08-22 ENCOUNTER — Other Ambulatory Visit: Payer: Self-pay | Admitting: Internal Medicine

## 2014-09-01 ENCOUNTER — Other Ambulatory Visit: Payer: Self-pay | Admitting: Internal Medicine

## 2014-09-21 ENCOUNTER — Other Ambulatory Visit: Payer: Self-pay | Admitting: Internal Medicine

## 2014-09-23 ENCOUNTER — Other Ambulatory Visit: Payer: Self-pay | Admitting: Internal Medicine

## 2014-10-30 ENCOUNTER — Other Ambulatory Visit: Payer: Self-pay | Admitting: Internal Medicine

## 2015-03-17 ENCOUNTER — Other Ambulatory Visit: Payer: Self-pay | Admitting: Emergency Medicine

## 2015-03-17 ENCOUNTER — Other Ambulatory Visit: Payer: Self-pay | Admitting: Internal Medicine

## 2015-03-17 MED ORDER — LORATADINE 10 MG PO TABS: ORAL_TABLET | ORAL | Status: DC

## 2015-03-20 ENCOUNTER — Other Ambulatory Visit: Payer: Self-pay | Admitting: Internal Medicine

## 2015-03-21 ENCOUNTER — Encounter: Payer: Self-pay | Admitting: Internal Medicine

## 2015-03-30 ENCOUNTER — Other Ambulatory Visit: Payer: Self-pay | Admitting: Internal Medicine

## 2015-05-01 ENCOUNTER — Other Ambulatory Visit: Payer: Self-pay | Admitting: Internal Medicine

## 2015-05-02 ENCOUNTER — Other Ambulatory Visit: Payer: Self-pay | Admitting: Internal Medicine

## 2015-05-12 ENCOUNTER — Ambulatory Visit (INDEPENDENT_AMBULATORY_CARE_PROVIDER_SITE_OTHER): Payer: Medicare Other | Admitting: Internal Medicine

## 2015-05-12 ENCOUNTER — Other Ambulatory Visit (INDEPENDENT_AMBULATORY_CARE_PROVIDER_SITE_OTHER): Payer: Medicare Other

## 2015-05-12 ENCOUNTER — Encounter: Payer: Self-pay | Admitting: Internal Medicine

## 2015-05-12 VITALS — BP 128/80 | HR 68 | Temp 98.6°F | Ht 72.5 in | Wt 206.0 lb

## 2015-05-12 DIAGNOSIS — IMO0002 Reserved for concepts with insufficient information to code with codable children: Secondary | ICD-10-CM

## 2015-05-12 DIAGNOSIS — E785 Hyperlipidemia, unspecified: Secondary | ICD-10-CM | POA: Diagnosis not present

## 2015-05-12 DIAGNOSIS — Z0000 Encounter for general adult medical examination without abnormal findings: Secondary | ICD-10-CM

## 2015-05-12 DIAGNOSIS — J4520 Mild intermittent asthma, uncomplicated: Secondary | ICD-10-CM | POA: Diagnosis not present

## 2015-05-12 DIAGNOSIS — J3081 Allergic rhinitis due to animal (cat) (dog) hair and dander: Secondary | ICD-10-CM

## 2015-05-12 DIAGNOSIS — R7309 Other abnormal glucose: Secondary | ICD-10-CM | POA: Diagnosis not present

## 2015-05-12 LAB — HEPATIC FUNCTION PANEL
ALBUMIN: 4.4 g/dL (ref 3.5–5.2)
ALK PHOS: 65 U/L (ref 39–117)
ALT: 25 U/L (ref 0–53)
AST: 19 U/L (ref 0–37)
Bilirubin, Direct: 0.1 mg/dL (ref 0.0–0.3)
TOTAL PROTEIN: 6.9 g/dL (ref 6.0–8.3)
Total Bilirubin: 0.6 mg/dL (ref 0.2–1.2)

## 2015-05-12 LAB — BASIC METABOLIC PANEL
BUN: 16 mg/dL (ref 6–23)
CALCIUM: 9.9 mg/dL (ref 8.4–10.5)
CO2: 29 meq/L (ref 19–32)
Chloride: 104 mEq/L (ref 96–112)
Creatinine, Ser: 0.98 mg/dL (ref 0.40–1.50)
GFR: 79.02 mL/min (ref >60.00)
Glucose, Bld: 80 mg/dL (ref 70–99)
POTASSIUM: 5.8 meq/L — AB (ref 3.5–5.1)
SODIUM: 140 meq/L (ref 135–145)

## 2015-05-12 LAB — LIPID PANEL
Cholesterol: 213 mg/dL — ABNORMAL HIGH (ref 0–200)
HDL: 86.2 mg/dL (ref >39.00)
LDL Cholesterol: 114 mg/dL — ABNORMAL HIGH (ref 0–99)
NONHDL: 126.46
Total CHOL/HDL Ratio: 2
Triglycerides: 60 mg/dL (ref 0.0–149.0)
VLDL: 12 mg/dL (ref 0.0–40.0)

## 2015-05-12 LAB — CBC WITH DIFFERENTIAL/PLATELET
BASOS ABS: 0.1 10*3/uL (ref 0.0–0.1)
Basophils Relative: 0.7 % (ref 0.0–3.0)
EOS PCT: 3.2 % (ref 0.0–5.0)
Eosinophils Absolute: 0.3 10*3/uL (ref 0.0–0.7)
HEMATOCRIT: 49 % (ref 39.0–52.0)
Hemoglobin: 16.3 g/dL (ref 13.0–17.0)
LYMPHS PCT: 16.5 % (ref 12.0–46.0)
Lymphs Abs: 1.5 10*3/uL (ref 0.7–4.0)
MCHC: 33.2 g/dL (ref 30.0–36.0)
MCV: 96.7 fl (ref 78.0–100.0)
MONOS PCT: 11 % (ref 3.0–12.0)
Monocytes Absolute: 1 10*3/uL (ref 0.1–1.0)
NEUTROS ABS: 6.3 10*3/uL (ref 1.4–7.7)
Neutrophils Relative %: 68.6 % (ref 43.0–77.0)
PLATELETS: 341 10*3/uL (ref 150.0–400.0)
RBC: 5.07 Mil/uL (ref 4.22–5.81)
RDW: 14 % (ref 11.5–15.5)
WBC: 9.1 10*3/uL (ref 4.0–10.5)

## 2015-05-12 LAB — PSA: PSA: 3.82 ng/mL (ref 0.10–4.00)

## 2015-05-12 LAB — TSH: TSH: 1.09 u[IU]/mL (ref 0.35–4.50)

## 2015-05-12 MED ORDER — LORATADINE 10 MG PO TABS: 10.0000 mg | ORAL_TABLET | Freq: Every day | ORAL | Status: DC

## 2015-05-12 MED ORDER — MONTELUKAST SODIUM 10 MG PO TABS: 10.0000 mg | ORAL_TABLET | Freq: Every day | ORAL | Status: DC

## 2015-05-12 MED ORDER — ALBUTEROL SULFATE HFA 108 (90 BASE) MCG/ACT IN AERS: 2.0000 | INHALATION_SPRAY | RESPIRATORY_TRACT | Status: DC | PRN

## 2015-05-12 MED ORDER — FLUTICASONE PROPIONATE 50 MCG/ACT NA SUSP: 1.0000 | Freq: Every day | NASAL | Status: DC

## 2015-05-12 NOTE — Assessment & Plan Note (Signed)
Lipids, LFTs, TSH ,BMET

## 2015-05-12 NOTE — Assessment & Plan Note (Signed)
CBC & dif 

## 2015-05-12 NOTE — Progress Notes (Signed)
Pre visit review using our clinic review tool, if applicable. No additional management support is needed unless otherwise documented below in the visit note. 

## 2015-05-12 NOTE — Assessment & Plan Note (Signed)
A1c

## 2015-05-12 NOTE — Patient Instructions (Signed)
  Your next office appointment will be determined based upon review of your pending labs. Those written interpretation of the lab results and instructions will be transmitted to you by My Chart  Critical results will be called.   Followup as needed for any active or acute issue. Please report any significant change in your symptoms. 

## 2015-05-12 NOTE — Progress Notes (Signed)
   Subjective:    Patient ID: Jeremy Li, male    DOB: Aug 17, 1940, 74 y.o.   MRN: [id number]  HPI The patient is here to assess status of active health conditions.  PMH, FH, & Social History reviewed & updated.No change in [place] as recorded.  He is on a heart healthy diet. He walks a mile plus 7 days a week walking the dog. He denies any associated cardio pulmonary symptoms.  He is using his albuterol after exposure to the dog; otherwise his asthma is stable with present medications .  He has seen a Copy for trigger finger of the right hand.  He has not had a colonoscopy. He states "I did not see a need for it". He denies GI symptoms.  He does want a PSA; he has no genitourinary symptoms. He believes his insurance will pay for it.  Review of Systems  Chest pain, palpitations, tachycardia, exertional dyspnea, paroxysmal nocturnal dyspnea, claudication or edema are absent. No unexplained weight loss, abdominal pain, significant dyspepsia, dysphagia, melena, rectal bleeding, or persistently small caliber stools. Dysuria, pyuria, hematuria, frequency, nocturia or polyuria are denied. Change in hair, skin, nails denied. No bowel changes of constipation or diarrhea. No intolerance to heat or cold.     Objective:   Physical Exam  Pertinent or positive findings include: Alopecia is present. Ptosis is present greater on the left than the right. Breath sounds are markedly decreased. He has a ventral and umbilical hernia. There is some enlargement of the PIP joints. Trigger finger is present in the right fourth finger. He has minimal Dupuytren's changes of the palms. He has slight crepitus of the knees. Digital rectal exam was declined; he wants a PSA.  General appearance :adequately nourished; in no distress.  Eyes: No conjunctival inflammation or scleral icterus is present.  Oral exam:  Lips and gums are healthy appearing.There is no oropharyngeal erythema or exudate noted. Dental  hygiene is good.  Heart:  Normal rate and regular rhythm. S1 and S2 normal without gallop, murmur, click, rub or other extra sounds    Lungs:Chest clear to auscultation; no wheezes, rhonchi,rales ,or rubs present.No increased work of breathing.   Abdomen: bowel sounds normal, soft and non-tender without masses, or organomegaly noted.  No guarding or rebound.   Vascular : all pulses equal ; no bruits present.  Skin:Warm & dry.  Intact without suspicious lesions or rashes ; no tenting or jaundice   Lymphatic: No lymphadenopathy is noted about the head, neck, axilla   Neuro: Strength, tone & DTRs normal.     Assessment & Plan:  See Current Assessment & Plan in Problem List under specific Diagnosis

## 2015-05-23 ENCOUNTER — Encounter: Payer: Self-pay | Admitting: Internal Medicine

## 2015-05-25 ENCOUNTER — Encounter: Payer: Self-pay | Admitting: Family

## 2015-05-25 ENCOUNTER — Other Ambulatory Visit: Payer: Medicare Other

## 2015-05-25 ENCOUNTER — Ambulatory Visit (INDEPENDENT_AMBULATORY_CARE_PROVIDER_SITE_OTHER): Payer: Medicare Other | Admitting: Family

## 2015-05-25 VITALS — BP 130/80 | HR 64 | Temp 98.4°F | Resp 18 | Ht 72.5 in | Wt 207.0 lb

## 2015-05-25 DIAGNOSIS — R350 Frequency of micturition: Secondary | ICD-10-CM | POA: Diagnosis not present

## 2015-05-25 LAB — POCT URINALYSIS DIPSTICK
Bilirubin, UA: NEGATIVE
Blood, UA: NEGATIVE
GLUCOSE UA: NEGATIVE
Ketones, UA: NEGATIVE
Nitrite, UA: NEGATIVE
SPEC GRAV UA: 1.02
UROBILINOGEN UA: NEGATIVE
pH, UA: 6

## 2015-05-25 MED ORDER — CIPROFLOXACIN HCL 500 MG PO TABS: 500.0000 mg | ORAL_TABLET | Freq: Two times a day (BID) | ORAL | Status: DC

## 2015-05-25 NOTE — Progress Notes (Signed)
   Subjective:    Patient ID: Jeremy Li, male    DOB: May 10, 1941, 74 y.o.   MRN: [id number]  Chief Complaint  Patient presents with  . Urinary Frequency    Urinary frequency, a little burning when urinating, cloudy urine    HPI:  Jeremy Li is a 74 y.o. male who  has a past medical history of Allergy. and presents today for an acute office visit.   Associated symptom urinary frequency and urgency has been going on for about 5 days. Occasional dysuria. Notes that he had urgency that has woken him up at night. Denies any fevers, chills, malaise, penile or pelvic pain, or myalgia. Denies any treatments in attempt to modify symptoms. Course of the symptoms is slightly improved.   No Known Allergies   Current Outpatient Prescriptions on File Prior to Visit  Medication Sig Dispense Refill  . albuterol (VENTOLIN HFA) 108 (90 BASE) MCG/ACT inhaler Inhale 2 puffs into the lungs every 4 (four) hours as needed. 3 Inhaler 3  . aspirin EC 81 MG tablet Take 81 mg by mouth daily.    . fluticasone (FLONASE) 50 MCG/ACT nasal spray Place 1 spray into both nostrils daily. 54 g 3  . loratadine (CLARITIN) 10 MG tablet Take 1 tablet (10 mg total) by mouth daily. 90 tablet 3  . Lutein 6 MG CAPS Take by mouth daily.    . montelukast (SINGULAIR) 10 MG tablet Take 1 tablet (10 mg total) by mouth at bedtime. 90 tablet 3   No current facility-administered medications on file prior to visit.     Past Surgical History  Procedure Laterality Date  . Lumbar disc surgery  1989    Dr Cheri Rous  . Cataract extraction, bilateral      Dr Katy Fitch  . Dupuytren / palmar fasciotomy      Dr Daylene Katayama    Review of Systems  Constitutional: Negative for fever and chills.  Genitourinary: Positive for urgency and frequency.      Objective:    BP 130/80 mmHg  Pulse 64  Temp(Src) 98.4 F (36.9 C) (Oral)  Resp 18  Ht 6' 0.5" (1.842 m)  Wt 207 lb (93.895 kg)  BMI 27.67 kg/m2  SpO2 94% Nursing note and vital  signs reviewed.  Physical Exam  Constitutional: He is oriented to person, place, and time. He appears well-developed and well-nourished. No distress.  Cardiovascular: Normal rate, regular rhythm, normal heart sounds and intact distal pulses.   Pulmonary/Chest: Effort normal and breath sounds normal.  Abdominal: There is no CVA tenderness.  Neurological: He is alert and oriented to person, place, and time.  Skin: Skin is warm and dry.  Psychiatric: He has a normal mood and affect. His behavior is normal. Judgment and thought content normal.       Assessment & Plan:   Problem List Items Addressed This Visit      Other   Urinary frequency - Primary    Symptoms and exam consistent with cystitis. In office UA positive for leukocytes and negative for nitrites and hematuria. Minimal concern for prostatitis. Start Ciprofloxacin. Encourage fluids. Follow up if symptoms worsen or fail to improve.       Relevant Medications   ciprofloxacin (CIPRO) 500 MG tablet   Other Relevant Orders   POCT urinalysis dipstick (Completed)   Urine culture

## 2015-05-25 NOTE — Assessment & Plan Note (Signed)
Symptoms and exam consistent with cystitis. In office UA positive for leukocytes and negative for nitrites and hematuria. Minimal concern for prostatitis. Start Ciprofloxacin. Encourage fluids. Follow up if symptoms worsen or fail to improve.

## 2015-05-25 NOTE — Telephone Encounter (Signed)
Pt has an appt with you today for urination changes. If the pt does need to void prior to being called back would like a UA completed at that time (given the urgency that pt states he is having)?  Please advise at your convenience

## 2015-05-25 NOTE — Progress Notes (Signed)
Pre visit review using our clinic review tool, if applicable. No additional management support is needed unless otherwise documented below in the visit note. 

## 2015-05-25 NOTE — Patient Instructions (Signed)
Thank you for choosing [facility].  Summary/Instructions:  Your prescription(s) have been submitted to your pharmacy or been printed and provided for you. Please take as directed and contact our office if you believe you are having problem(s) with the medication(s) or have any questions.  If your symptoms worsen or fail to improve, please contact our office for further instruction, or in case of emergency go directly to the emergency room at the closest medical facility.    Urinary Tract Infection Urinary tract infections (UTIs) can develop anywhere along your urinary tract. Your urinary tract is your body's drainage system for removing wastes and extra water. Your urinary tract includes two kidneys, two ureters, a bladder, and a urethra. Your kidneys are a pair of bean-shaped organs. Each kidney is about the size of your fist. They are located below your ribs, one on each side of your spine. CAUSES Infections are caused by microbes, which are microscopic organisms, including fungi, viruses, and bacteria. These organisms are so small that they can only be seen through a microscope. Bacteria are the microbes that most commonly cause UTIs. SYMPTOMS  Symptoms of UTIs may vary by age and gender of the patient and by the location of the infection. Symptoms in young women typically include a frequent and intense urge to urinate and a painful, burning feeling in the bladder or urethra during urination. Older women and men are more likely to be tired, shaky, and weak and have muscle aches and abdominal pain. A fever may mean the infection is in your kidneys. Other symptoms of a kidney infection include pain in your back or sides below the ribs, nausea, and vomiting. DIAGNOSIS To diagnose a UTI, your caregiver will ask you about your symptoms. Your caregiver will also ask you to provide a urine sample. The urine sample will be tested for bacteria and white blood cells. White blood cells are made by  your body to help fight infection. TREATMENT  Typically, UTIs can be treated with medication. Because most UTIs are caused by a bacterial infection, they usually can be treated with the use of antibiotics. The choice of antibiotic and length of treatment depend on your symptoms and the type of bacteria causing your infection. HOME CARE INSTRUCTIONS  If you were prescribed antibiotics, take them exactly as your caregiver instructs you. Finish the medication even if you feel better after you have only taken some of the medication.  Drink enough water and fluids to keep your urine clear or pale yellow.  Avoid caffeine, tea, and carbonated beverages. They tend to irritate your bladder.  Empty your bladder often. Avoid holding urine for long periods of time.  Empty your bladder before and after sexual intercourse.  After a bowel movement, women should cleanse from front to back. Use each tissue only once. SEEK MEDICAL CARE IF:   You have back pain.  You develop a fever.  Your symptoms do not begin to resolve within 3 days. SEEK IMMEDIATE MEDICAL CARE IF:   You have severe back pain or lower abdominal pain.  You develop chills.  You have nausea or vomiting.  You have continued burning or discomfort with urination. MAKE SURE YOU:   Understand these instructions.  Will watch your condition.  Will get help right away if you are not doing well or get worse.   This information is not intended to replace advice given to you by your health care provider. Make sure you discuss any questions you have with your health  care provider.   Document Released: 04/17/2005 Document Revised: 03/29/2015 Document Reviewed: 08/16/2011 Elsevier Interactive Patient Education 2016 Elsevier Inc.    Prostatitis The prostate gland is about the size and shape of a walnut. It is located just below your bladder. It produces one of the components of semen, which is made up of sperm and the fluids that help  nourish and transport it out from the testicles. Prostatitis is inflammation of the prostate gland.  There are four types of prostatitis:  Acute bacterial prostatitis. This is the least common type of prostatitis. It starts quickly and usually is associated with a bladder infection, high fever, and shaking chills. It can occur at any age.  Chronic bacterial prostatitis. This is a persistent bacterial infection in the prostate. It usually develops from repeated acute bacterial prostatitis or acute bacterial prostatitis that was not properly treated. It can occur in men of any age but is most common in middle-aged men whose prostate has begun to enlarge. The symptoms are not as severe as those in acute bacterial prostatitis. Discomfort in the part of your body that is in front of your rectum and below your scrotum (perineum), lower abdomen, or in the head of your penis (glans) may represent your primary discomfort.  Chronic prostatitis (nonbacterial). This is the most common type of prostatitis. It is inflammation of the prostate gland that is not caused by a bacterial infection. The cause is unknown and may be associated with a viral infection or autoimmune disorder.  Prostatodynia (pelvic floor disorder). This is associated with increased muscular tone in the pelvis surrounding the prostate. CAUSES The causes of bacterial prostatitis are bacterial infection. The causes of the other types of prostatitis are unknown.  SYMPTOMS  Symptoms can vary depending upon the type of prostatitis that exists. There can also be overlap in symptoms. Possible symptoms for each type of prostatitis are listed below. Acute Bacterial Prostatitis  Painful urination.  Fever or chills.  Muscle or joint pains.  Low back pain.  Low abdominal pain.  Inability to empty bladder completely. Chronic Bacterial Prostatitis, Chronic Nonbacterial Prostatitis, and Prostatodynia  Sudden urge to urinate.  Frequent  urination.  Difficulty starting urine stream.  Weak urine stream.  Discharge from the urethra.  Dribbling after urination.  Rectal pain.  Pain in the testicles, penis, or tip of the penis.  Pain in the perineum.  Problems with sexual function.  Painful ejaculation.  Bloody semen. DIAGNOSIS  In order to diagnose prostatitis, your health care provider will ask about your symptoms. One or more urine samples will be taken and tested (urinalysis). If the urinalysis result is negative for bacteria, your health care provider may use a finger to feel your prostate (digital rectal exam). This exam helps your health care provider determine if your prostate is swollen and tender. It will also produce a specimen of semen that can be analyzed. TREATMENT  Treatment for prostatitis depends on the cause. If a bacterial infection is the cause, it can be treated with antibiotic medicine. In cases of chronic bacterial prostatitis, the use of antibiotics for up to 1 month or 6 weeks may be necessary. Your health care provider may instruct you to take sitz baths to help relieve pain. A sitz bath is a bath of hot water in which your hips and buttocks are under water. This relaxes the pelvic floor muscles and often helps to relieve the pressure on your prostate. HOME CARE INSTRUCTIONS   Take all medicines as directed  by your health care provider.  Take sitz baths as directed by your health care provider. SEEK MEDICAL CARE IF:   Your symptoms get worse, not better.  You have a fever. SEEK IMMEDIATE MEDICAL CARE IF:   You have chills.  You feel nauseous or vomit.  You feel lightheaded or faint.  You are unable to urinate.  You have blood or blood clots in your urine. MAKE SURE YOU:  Understand these instructions.  Will watch your condition.  Will get help right away if you are not doing well or get worse.   This information is not intended to replace advice given to you by your health  care provider. Make sure you discuss any questions you have with your health care provider.   Document Released: 07/05/2000 Document Revised: 07/29/2014 Document Reviewed: 01/25/2013 Elsevier Interactive Patient Education [employer].

## 2015-05-26 LAB — URINE CULTURE
Colony Count: NO GROWTH
ORGANISM ID, BACTERIA: NO GROWTH

## 2015-05-29 ENCOUNTER — Encounter: Payer: Self-pay | Admitting: Family

## 2015-06-12 ENCOUNTER — Encounter: Payer: Self-pay | Admitting: Family

## 2015-06-12 ENCOUNTER — Encounter: Payer: Self-pay | Admitting: Internal Medicine

## 2015-06-14 ENCOUNTER — Other Ambulatory Visit: Payer: Self-pay | Admitting: Internal Medicine

## 2015-07-12 ENCOUNTER — Telehealth: Payer: Self-pay

## 2015-07-12 NOTE — Telephone Encounter (Signed)
Erroneous

## 2015-08-23 ENCOUNTER — Encounter: Payer: Self-pay | Admitting: Family

## 2015-08-24 MED ORDER — ALBUTEROL SULFATE HFA 108 (90 BASE) MCG/ACT IN AERS: 2.0000 | INHALATION_SPRAY | RESPIRATORY_TRACT | Status: DC | PRN

## 2016-04-17 DIAGNOSIS — H5202 Hypermetropia, left eye: Secondary | ICD-10-CM | POA: Diagnosis not present

## 2016-04-17 DIAGNOSIS — H52223 Regular astigmatism, bilateral: Secondary | ICD-10-CM | POA: Diagnosis not present

## 2016-04-17 DIAGNOSIS — H5211 Myopia, right eye: Secondary | ICD-10-CM | POA: Diagnosis not present

## 2016-04-17 DIAGNOSIS — Z9849 Cataract extraction status, unspecified eye: Secondary | ICD-10-CM | POA: Diagnosis not present

## 2016-04-17 DIAGNOSIS — H524 Presbyopia: Secondary | ICD-10-CM | POA: Diagnosis not present

## 2016-04-17 DIAGNOSIS — Z961 Presence of intraocular lens: Secondary | ICD-10-CM | POA: Diagnosis not present

## 2016-05-31 ENCOUNTER — Telehealth: Payer: Medicare Other | Admitting: Family

## 2016-05-31 DIAGNOSIS — J3089 Other allergic rhinitis: Secondary | ICD-10-CM

## 2016-05-31 MED ORDER — MONTELUKAST SODIUM 10 MG PO TABS: 10.0000 mg | ORAL_TABLET | Freq: Every day | ORAL | 0 refills | Status: DC

## 2016-05-31 MED ORDER — FLUTICASONE PROPIONATE 50 MCG/ACT NA SUSP: 2.0000 | Freq: Every day | NASAL | 0 refills | Status: DC

## 2016-05-31 MED ORDER — ALBUTEROL SULFATE HFA 108 (90 BASE) MCG/ACT IN AERS: 2.0000 | INHALATION_SPRAY | Freq: Four times a day (QID) | RESPIRATORY_TRACT | 0 refills | Status: DC | PRN

## 2016-05-31 NOTE — Progress Notes (Signed)
E visit for Allergic Rhinitis We are sorry that you are not feeling well.  Her is how we plan to help!  Based on what you have shared with me it looks like you have Allergic Rhinitis.  Rhinitis is when a reaction occurs that causes nasal congestion, runny nose, sneezing, and itching.  Most types of rhinitis are caused by an inflammation and are associated with symptoms in the eyes ears or throat. There are several types of rhinitis.  The most common are acute rhinitis, which is usually caused by a viral illness, allergic or seasonal rhinitis, and nonallergic or year-round rhinitis.  Nasal allergies occur certain times of the year.  Allergic rhinitis is caused when allergens in the air trigger the release of histamine in the body.  Histamine causes itching, swelling, and fluid to build up in the fragile linings of the nasal passages, sinuses and eyelids.  An itchy nose and clear discharge are common.  I recommend the following over the counter treatments: I have refilled your meds as requested (Singulair, albuterol, flonase)  HOME CARE:   You can use an over-the-counter saline nasal spray as needed  Avoid areas where there is heavy dust, mites, or molds  Stay indoors on windy days during the pollen season  Keep windows closed in home, at least in bedroom; use air conditioner.  Use high-efficiency house air filter  Keep windows closed in car, turn AC on re-circulate  Avoid playing out with dog during pollen season  GET HELP RIGHT AWAY IF:   If your symptoms do not improve within 10 days  You become short of breath  You develop yellow or green discharge from your nose for over 3 days  You have coughing fits  MAKE SURE YOU:   Understand these instructions  Will watch your condition  Will get help right away if you are not doing well or get worse  Thank you for choosing an e-visit. Your e-visit answers were reviewed by a board certified advanced clinical practitioner to  complete your personal care plan. Depending upon the condition, your plan could have included both over the counter or prescription medications. Please review your pharmacy choice. Be sure that the pharmacy you have chosen is open so that you can pick up your prescription now.  If there is a problem you may message your provider in [place] to have the prescription routed to another pharmacy. Your safety is important to [place]. If you have drug allergies check your prescription carefully.  For the next 24 hours, you can use MyChart to ask questions about today's visit, request a non-urgent call back, or ask for a work or school excuse from your e-visit provider. You will get an email in the next two days asking about your experience. I hope that your e-visit has been valuable and will speed your recovery.

## 2016-07-11 ENCOUNTER — Other Ambulatory Visit: Payer: Self-pay | Admitting: Family

## 2016-07-11 DIAGNOSIS — J3089 Other allergic rhinitis: Secondary | ICD-10-CM

## 2016-07-23 ENCOUNTER — Other Ambulatory Visit (INDEPENDENT_AMBULATORY_CARE_PROVIDER_SITE_OTHER): Payer: PPO

## 2016-07-23 ENCOUNTER — Ambulatory Visit (INDEPENDENT_AMBULATORY_CARE_PROVIDER_SITE_OTHER): Payer: PPO | Admitting: Family

## 2016-07-23 ENCOUNTER — Encounter: Payer: Self-pay | Admitting: Family

## 2016-07-23 VITALS — BP 130/80 | HR 65 | Temp 97.9°F | Resp 16 | Ht 72.5 in | Wt 202.8 lb

## 2016-07-23 DIAGNOSIS — Z0000 Encounter for general adult medical examination without abnormal findings: Secondary | ICD-10-CM | POA: Insufficient documentation

## 2016-07-23 DIAGNOSIS — Z125 Encounter for screening for malignant neoplasm of prostate: Secondary | ICD-10-CM

## 2016-07-23 DIAGNOSIS — E782 Mixed hyperlipidemia: Secondary | ICD-10-CM

## 2016-07-23 DIAGNOSIS — J4540 Moderate persistent asthma, uncomplicated: Secondary | ICD-10-CM

## 2016-07-23 DIAGNOSIS — J3089 Other allergic rhinitis: Secondary | ICD-10-CM

## 2016-07-23 DIAGNOSIS — R972 Elevated prostate specific antigen [PSA]: Secondary | ICD-10-CM

## 2016-07-23 LAB — LIPID PANEL
CHOL/HDL RATIO: 2
CHOLESTEROL: 206 mg/dL — AB (ref 0–200)
HDL: 88.7 mg/dL (ref >39.00)
LDL Cholesterol: 100 mg/dL — ABNORMAL HIGH (ref 0–99)
NonHDL: 116.94
TRIGLYCERIDES: 85 mg/dL (ref 0.0–149.0)
VLDL: 17 mg/dL (ref 0.0–40.0)

## 2016-07-23 LAB — COMPREHENSIVE METABOLIC PANEL
ALT: 19 U/L (ref 0–53)
AST: 17 U/L (ref 0–37)
Albumin: 4.5 g/dL (ref 3.5–5.2)
Alkaline Phosphatase: 63 U/L (ref 39–117)
BUN: 15 mg/dL (ref 6–23)
CALCIUM: 9.3 mg/dL (ref 8.4–10.5)
CO2: 31 mEq/L (ref 19–32)
Chloride: 104 mEq/L (ref 96–112)
Creatinine, Ser: 0.93 mg/dL (ref 0.40–1.50)
GFR: 83.68 mL/min (ref >60.00)
GLUCOSE: 74 mg/dL (ref 70–99)
POTASSIUM: 4.7 meq/L (ref 3.5–5.1)
Sodium: 140 mEq/L (ref 135–145)
Total Bilirubin: 0.6 mg/dL (ref 0.2–1.2)
Total Protein: 6.7 g/dL (ref 6.0–8.3)

## 2016-07-23 LAB — CBC
HEMATOCRIT: 47.1 % (ref 39.0–52.0)
HEMOGLOBIN: 16.1 g/dL (ref 13.0–17.0)
MCHC: 34.2 g/dL (ref 30.0–36.0)
MCV: 97.2 fl (ref 78.0–100.0)
PLATELETS: 349 10*3/uL (ref 150.0–400.0)
RBC: 4.84 Mil/uL (ref 4.22–5.81)
RDW: 14.2 % (ref 11.5–15.5)
WBC: 7.4 10*3/uL (ref 4.0–10.5)

## 2016-07-23 LAB — HEMOGLOBIN A1C: Hgb A1c MFr Bld: 5.8 % (ref 4.6–6.5)

## 2016-07-23 LAB — PSA, MEDICARE: PSA: 7.43 ng/mL — AB (ref 0.10–4.00)

## 2016-07-23 MED ORDER — FLUTICASONE PROPIONATE 50 MCG/ACT NA SUSP: 1.0000 | Freq: Every day | NASAL | 3 refills | Status: DC

## 2016-07-23 MED ORDER — ALBUTEROL SULFATE HFA 108 (90 BASE) MCG/ACT IN AERS: 2.0000 | INHALATION_SPRAY | RESPIRATORY_TRACT | 3 refills | Status: DC | PRN

## 2016-07-23 MED ORDER — MONTELUKAST SODIUM 10 MG PO TABS: 10.0000 mg | ORAL_TABLET | Freq: Every day | ORAL | 3 refills | Status: DC

## 2016-07-23 NOTE — Assessment & Plan Note (Signed)
Asthma appears labile with current regimen given continued need for albuterol on a daily basis with a triggering factor of exposure to animals. Discussed possible addition of inhaled corticosteroid which patient declines at present time. Continue current dosage of albuterol and montelukast. Follow-up if symptoms worsen or do not improve.

## 2016-07-23 NOTE — Patient Instructions (Addendum)
Thank you for choosing [facility].  SUMMARY AND INSTRUCTIONS:  Medication:  Continue to take your medications as prescribed.   Your prescription(s) have been submitted to your pharmacy or been printed and provided for you. Please take as directed and contact our office if you believe you are having problem(s) with the medication(s) or have any questions.   Labs:  Please stop by the lab on the lower level of the building for your blood work. Your results will be released to [facility] (or called to you) after review, usually within 72 hours after test completion. If any changes need to be made, you will be notified at that same time.  1.) The lab is open from 7:30am to 5:30 pm Monday-Friday 2.) No appointment is necessary 3.) Fasting (if needed) is 6-8 hours after food and drink; black coffee and water are okay   Follow up:  If your symptoms worsen or fail to improve, please contact our office for further instruction, or in case of emergency go directly to the emergency room at the closest medical facility.   Health Maintenance  Topic Date Due  . TETANUS/TDAP  09/28/2018  . INFLUENZA VACCINE  Completed  . ZOSTAVAX  Completed  . PNA vac Low Risk Adult  Completed     Health Maintenance, Male A healthy lifestyle and preventative care can promote health and wellness.  Maintain regular health, dental, and eye exams.  Eat a healthy diet. Foods like vegetables, fruits, whole grains, low-fat dairy products, and lean protein foods contain the nutrients you need and are low in calories. Decrease your intake of foods high in solid fats, added sugars, and salt. Get information about a proper diet from your health care provider, if necessary.  Regular physical exercise is one of the most important things you can do for your health. Most adults should get at least 150 minutes of moderate-intensity exercise (any activity that increases your heart rate and causes you to sweat) each week. In  addition, most adults need muscle-strengthening exercises on 2 or more days a week.   Maintain a healthy weight. The body mass index (BMI) is a screening tool to identify possible weight problems. It provides an estimate of body fat based on height and weight. Your health care provider can find your BMI and can help you achieve or maintain a healthy weight. For males 20 years and older:  A BMI below 18.5 is considered underweight.  A BMI of 18.5 to 24.9 is normal.  A BMI of 25 to 29.9 is considered overweight.  A BMI of 30 and above is considered obese.  Maintain normal blood lipids and cholesterol by exercising and minimizing your intake of saturated fat. Eat a balanced diet with plenty of fruits and vegetables. Blood tests for lipids and cholesterol should begin at age 82 and be repeated every 5 years. If your lipid or cholesterol levels are high, you are over age 24, or you are at high risk for heart disease, you may need your cholesterol levels checked more frequently.Ongoing high lipid and cholesterol levels should be treated with medicines if diet and exercise are not working.  If you smoke, find out from your health care provider how to quit. If you do not use tobacco, do not start.  Lung cancer screening is recommended for adults aged 75-80 years who are at high risk for developing lung cancer because of a history of smoking. A yearly low-dose CT scan of the lungs is recommended for people who  have at least a 30-pack-year history of smoking and are current smokers or have quit within the past 15 years. A pack year of smoking is smoking an average of 1 pack of cigarettes a day for 1 year (for example, a 30-pack-year history of smoking could mean smoking 1 pack a day for 30 years or 2 packs a day for 15 years). Yearly screening should continue until the smoker has stopped smoking for at least 15 years. Yearly screening should be stopped for people who develop a health problem that would  prevent them from having lung cancer treatment.  If you choose to drink alcohol, do not have more than 2 drinks per day. One drink is considered to be 12 oz (360 mL) of beer, 5 oz (150 mL) of wine, or 1.5 oz (45 mL) of liquor.  Avoid the use of street drugs. Do not share needles with anyone. Ask for help if you need support or instructions about stopping the use of drugs.  High blood pressure causes heart disease and increases the risk of stroke. High blood pressure is more likely to develop in:  People who have blood pressure in the end of the normal range (100-139/85-89 mm Hg).  People who are overweight or obese.  People who are African American.  If you are 79-17 years of age, have your blood pressure checked every 3-5 years. If you are 79 years of age or older, have your blood pressure checked every year. You should have your blood pressure measured twice-once when you are at a hospital or clinic, and once when you are not at a hospital or clinic. Record the average of the two measurements. To check your blood pressure when you are not at a hospital or clinic, you can use:  An automated blood pressure machine at a pharmacy.  A home blood pressure monitor.  If you are 43-25 years old, ask your health care provider if you should take aspirin to prevent heart disease.  Diabetes screening involves taking a blood sample to check your fasting blood sugar level. This should be done once every 3 years after age 60 if you are at a normal weight and without risk factors for diabetes. Testing should be considered at a younger age or be carried out more frequently if you are overweight and have at least 1 risk factor for diabetes.  Colorectal cancer can be detected and often prevented. Most routine colorectal cancer screening begins at the age of 74 and continues through age 38. However, your health care provider may recommend screening at an earlier age if you have risk factors for colon cancer. On  a yearly basis, your health care provider may provide home test kits to check for hidden blood in the stool. A small camera at the end of a tube may be used to directly examine the colon (sigmoidoscopy or colonoscopy) to detect the earliest forms of colorectal cancer. Talk to your health care provider about this at age 21 when routine screening begins. A direct exam of the colon should be repeated every 5-10 years through age 32, unless early forms of precancerous polyps or small growths are found.  People who are at an increased risk for hepatitis B should be screened for this virus. You are considered at high risk for hepatitis B if:  You were born in a country where hepatitis B occurs often. Talk with your health care provider about which countries are considered high risk.  Your parents were born in  a high-risk country and you have not received a shot to protect against hepatitis B (hepatitis B vaccine).  You have HIV or AIDS.  You use needles to inject street drugs.  You live with, or have sex with, someone who has hepatitis B.  You are a man who has sex with other men (MSM).  You get hemodialysis treatment.  You take certain medicines for conditions like cancer, organ transplantation, and autoimmune conditions.  Hepatitis C blood testing is recommended for all people born from [birth year] through 1967 and any individual with known risk factors for hepatitis C.  Healthy men should no longer receive prostate-specific antigen (PSA) blood tests as part of routine cancer screening. Talk to your health care provider about prostate cancer screening.  Testicular cancer screening is not recommended for adolescents or adult males who have no symptoms. Screening includes self-exam, a health care provider exam, and other screening tests. Consult with your health care provider about any symptoms you have or any concerns you have about testicular cancer.  Practice safe sex. Use condoms and avoid high-risk  sexual practices to reduce the spread of sexually transmitted infections (STIs).  You should be screened for STIs, including gonorrhea and chlamydia if:  You are sexually active and are younger than 24 years.  You are older than 24 years, and your health care provider tells you that you are at risk for this type of infection.  Your sexual activity has changed since you were last screened, and you are at an increased risk for chlamydia or gonorrhea. Ask your health care provider if you are at risk.  If you are at risk of being infected with HIV, it is recommended that you take a prescription medicine daily to prevent HIV infection. This is called pre-exposure prophylaxis (PrEP). You are considered at risk if:  You are a man who has sex with other men (MSM).  You are a heterosexual man who is sexually active with multiple partners.  You take drugs by injection.  You are sexually active with a partner who has HIV.  Talk with your health care provider about whether you are at high risk of being infected with HIV. If you choose to begin PrEP, you should first be tested for HIV. You should then be tested every 3 months for as long as you are taking PrEP.  Use sunscreen. Apply sunscreen liberally and repeatedly throughout the day. You should seek shade when your shadow is shorter than you. Protect yourself by wearing long sleeves, pants, a wide-brimmed hat, and sunglasses year round whenever you are outdoors.  Tell your health care provider of new moles or changes in moles, especially if there is a change in shape or color. Also, tell your health care provider if a mole is larger than the size of a pencil eraser.  A one-time screening for abdominal aortic aneurysm (AAA) and surgical repair of large AAAs by ultrasound is recommended for men aged 88-75 years who are current or former smokers.  Stay current with your vaccines (immunizations). This information is not intended to replace advice  given to you by your health care provider. Make sure you discuss any questions you have with your health care provider. Document Released: 01/04/2008 Document Revised: 07/29/2014 Document Reviewed: 04/11/2015 Elsevier Interactive Patient Education  2017 [employer].

## 2016-07-23 NOTE — Assessment & Plan Note (Signed)
Reviewed and updated patient's medical, surgical, family and social history. Medications and allergies were also reviewed. Basic screenings for depression, activities of daily living, hearing, cognition and safety were performed. Provider list was updated and health plan was provided to the patient.   All immunizations are up-to-date per recommendations. Obtain PSA for prostate cancer screening. Obtain lipid profile for cholesterol screening. Obtain A1c for diabetes screening. Due for a dental screen encouraged to be completed independently. All other screenings are up-to-date per recommendations.  Overall well exam with risk factors for cardiovascular disease including hyperlipidemia which is currently maintained on lifestyle management. Asthma appears adequately controlled although using albuterol inhaler on a daily basis which is most likely related to his dog. Encouraged increase physical activity to 30 minutes of moderate level activity daily. Continue other healthy lifestyle behaviors and choices. Follow-up prevention exam in 1 year. Follow-up office visit pending blood work or as needed.

## 2016-07-23 NOTE — Assessment & Plan Note (Signed)
Previous hyperlipidemia appears adequately controlled with lifestyle management and previous HDL of 88. Obtain lipid profile to check current status. Continue with lifestyle management pending lipid profile results.

## 2016-07-23 NOTE — Progress Notes (Signed)
Subjective:    Patient ID: Jeremy Li, male    DOB: 1941-05-05, 76 y.o.   MRN: [id number]  Chief Complaint  Patient presents with  . Medicare Wellness    HPI:  Jeremy Li is a 76 y.o. male who presents today for a Medicare Annual Wellness/Physical exam.    1) Health Maintenance -   Diet - Averages about 3 meals per day consisting of a Mediterranean Diet; Caffeine intake 2-3 cups per day.   Exercise - Walking about 1 mile per day.   2) Preventative Exams / Immunizations:  Dental -- Due for exam.   Vision -- Up to date   Health Maintenance  Topic Date Due  . TETANUS/TDAP  09/28/2018  . INFLUENZA VACCINE  Completed  . ZOSTAVAX  Completed  . PNA vac Low Risk Adult  Completed     Immunization History  Administered Date(s) Administered  . Influenza Whole 05/25/2008, 05/22/2009, 04/21/2012  . Influenza, High Dose Seasonal PF 04/21/2014  . Influenza, Seasonal, Injecte, Preservative Fre 05/07/2013  . Influenza-Unspecified 05/29/2015  . Pneumococcal Conjugate-13 04/21/2014  . Pneumococcal Polysaccharide-23 05/29/2015  . Td 09/27/2008  . Zoster 03/29/2014    RISK FACTORS  Tobacco History  Smoking Status  . Former Smoker  . Quit date: 07/22/1961  Smokeless Tobacco  . Never Used    Comment: smoked pipe 1-2 bowls/ day ages [age]     Cardiac risk factors: advanced age (older than 12 for men, 69 for women), dyslipidemia and male gender.  Depression Screen  Depression screen [facility] 2/9 07/23/2016  Decreased Interest 0  Down, Depressed, Hopeless 0  PHQ - 2 Score 0     Activities of Daily Living In your present state of health, do you have any difficulty performing the following activities?:  Driving? No Managing money?  No Feeding yourself? No Getting from bed to chair? No Climbing a flight of stairs? No Preparing food and eating?: No Bathing or showering? No Getting dressed: No Getting to the toilet? No Using the toilet: No Moving around from place  to place: No In the past year have you fallen or had a near fall?:No   Home Safety Has smoke detector and wears seat belts. No firearms. No excess sun exposure. Are there smokers in your home (other than you)?  No Do you feel safe at home?  Yes  Hearing Difficulties: No Do you often ask people to speak up or repeat themselves? No Do you experience ringing or noises in your ears? No  Do you have difficulty understanding soft or whispered voices? No    Cognitive Testing  Alert? Yes   Normal Appearance? Yes  Oriented to person? Yes  Place? Yes   Time? Yes  Recall of three objects?  Yes  Can perform simple calculations? Yes  Displays appropriate judgment? Yes  Can read the correct time from a watch face? Yes  Do you feel that you have a problem with memory? No  Do you often misplace items? No   Advanced Directives have been discussed with the patient? Yes   Current Physicians/Providers and Suppliers  1. Jeremy Piedra, FNP - Internal Medicine  Indicate any recent Medical Services you may have received from other than [facility] providers in the past year (date may be approximate).  All answers were reviewed with the patient and necessary referrals were made:  Jeremy Li, [place]   07/23/2016    No Known Allergies   Outpatient Medications Prior to Visit  Medication Sig  Dispense Refill  . aspirin EC 81 MG tablet Take 81 mg by mouth daily.    Marland Kitchen loratadine (CLARITIN) 10 MG tablet TAKE 1 TABLET BY MOUTH EVERY DAY AS NEEDED FOR ALLERGIES 90 tablet 0  . Lutein 6 MG CAPS Take by mouth daily.    Marland Kitchen albuterol (PROVENTIL HFA;VENTOLIN HFA) 108 (90 Base) MCG/ACT inhaler Inhale 2 puffs into the lungs every 6 (six) hours as needed for wheezing or shortness of breath. 1 Inhaler 0  . albuterol (VENTOLIN HFA) 108 (90 Base) MCG/ACT inhaler Inhale 2 puffs into the lungs every 4 (four) hours as needed. 3 Inhaler 3  . ciprofloxacin (CIPRO) 500 MG tablet Take 1 tablet (500 mg total) by mouth 2 (two) times  daily. 14 tablet 0  . fluticasone (FLONASE) 50 MCG/ACT nasal spray Place 1 spray into both nostrils daily. 54 g 3  . loratadine (CLARITIN) 10 MG tablet Take 1 tablet (10 mg total) by mouth daily. 90 tablet 3  . montelukast (SINGULAIR) 10 MG tablet Take 1 tablet (10 mg total) by mouth at bedtime. 90 tablet 3  . montelukast (SINGULAIR) 10 MG tablet Take 1 tablet (10 mg total) by mouth at bedtime. 30 tablet 0  . fluticasone (FLONASE) 50 MCG/ACT nasal spray Place 2 sprays into both nostrils daily. 16 g 0   No facility-administered medications prior to visit.      Past Medical History:  Diagnosis Date  . Allergy    pets & seasonal allergens     Past Surgical History:  Procedure Laterality Date  . CATARACT EXTRACTION, BILATERAL     Dr Jeremy Li  . DUPUYTREN / PALMAR FASCIOTOMY     Dr Jeremy Li  . LUMBAR DISC SURGERY  1989   Dr Jeremy Li     Family History  Problem Relation Age of Onset  . Heart disease Sister     cardiomegaly  . COPD Neg Hx   . Asthma Neg Hx   . Cancer Neg Hx   . Diabetes Neg Hx   . Stroke Neg Hx      Social History   Social History  . Marital status: Married    Spouse name: N/A  . Number of children: 3  . Years of education: 14   Occupational History  . Retired    Social History Main Topics  . Smoking status: Former Smoker    Quit date: 07/22/1961  . Smokeless tobacco: Never Used     Comment: smoked pipe 1-2 bowls/ day ages [age]  . Alcohol use 8.4 oz/week    14 Glasses of wine per week     Comment: Moderate  . Drug use: No  . Sexual activity: Not on file   Other Topics Concern  . Not on file   Social History Narrative   Denies abuse and feels safe at home.      Review of Systems  Constitutional: Denies fever, chills, fatigue, or significant weight gain/loss. HENT: Head: Denies headache or neck pain Ears: Denies changes in hearing, ringing in ears, earache, drainage Nose: Denies discharge, stuffiness, itching, nosebleed, sinus  pain Throat: Denies sore throat, hoarseness, dry mouth, sores, thrush Eyes: Denies loss/changes in vision, pain, redness, blurry/double vision, flashing lights Cardiovascular: Denies chest pain/discomfort, tightness, palpitations, shortness of breath with activity, difficulty lying down, swelling, sudden awakening with shortness of breath Respiratory: Denies shortness of breath, cough, sputum production, wheezing Gastrointestinal: Denies dysphasia, heartburn, change in appetite, nausea, change in bowel habits, rectal bleeding, constipation, diarrhea, yellow skin or eyes Genitourinary:  Denies frequency, urgency, burning/pain, blood in urine, incontinence, change in urinary strength. Musculoskeletal: Denies muscle/joint pain, stiffness, back pain, redness or swelling of joints, trauma Skin: Denies rashes, lumps, itching, dryness, color changes, or hair/nail changes Neurological: Denies dizziness, fainting, seizures, weakness, numbness, tingling, tremor Psychiatric - Denies nervousness, stress, depression or memory loss Endocrine: Denies heat or cold intolerance, sweating, frequent urination, excessive thirst, changes in appetite Hematologic: Denies ease of bruising or bleeding    Objective:     BP 130/80 (BP Location: Left Arm, Patient Position: Sitting, Cuff Size: Normal)   Pulse 65   Temp 97.9 F (36.6 C) (Oral)   Resp 16   Ht 6' 0.5" (1.842 m)   Wt 202 lb 12.8 oz (92 kg)   SpO2 96%   BMI 27.13 kg/m  Nursing note and vital signs reviewed.  Physical Exam  Constitutional: He is oriented to person, place, and time. He appears well-developed and well-nourished. No distress.  Cardiovascular: Normal rate, regular rhythm, normal heart sounds and intact distal pulses.   Pulmonary/Chest: Effort normal and breath sounds normal.  Neurological: He is alert and oriented to person, place, and time.  Skin: Skin is warm and dry.  Psychiatric: He has a normal mood and affect. His behavior is  normal. Judgment and thought content normal.       Assessment & Plan:   During the course of the visit the patient was educated and counseled about appropriate screening and preventive services including:    Pneumococcal vaccine   Td vaccine  Prostate cancer screening  Nutrition counseling   Diet review for nutrition referral? Yes ____  Not Indicated _X___   Patient Instructions (the written plan) was given to the patient.  Medicare Attestation I have personally reviewed: The patient's medical and social history Their use of alcohol, tobacco or illicit drugs Their current medications and supplements The patient's functional ability including ADLs,fall risks, home safety risks, cognitive, and hearing and visual impairment Diet and physical activities Evidence for depression or mood disorders  The patient's weight, height, BMI,  have been recorded in the chart.  I have made referrals, counseling, and provided education to the patient based on review of the above and I have provided the patient with a written personalized care plan for preventive services.     Problem List Items Addressed This Visit      Respiratory   Allergic rhinitis    Stable with current dosage of loratadine and montelukast. Triggered by his pet dog. Continue current dosage of loratadine and montelukast. Continue to monitor.      Relevant Medications   montelukast (SINGULAIR) 10 MG tablet   Asthma    Asthma appears labile with current regimen given continued need for albuterol on a daily basis with a triggering factor of exposure to animals. Discussed possible addition of inhaled corticosteroid which patient declines at present time. Continue current dosage of albuterol and montelukast. Follow-up if symptoms worsen or do not improve.      Relevant Medications   albuterol (VENTOLIN HFA) 108 (90 Base) MCG/ACT inhaler   montelukast (SINGULAIR) 10 MG tablet   Other Relevant Orders   CBC (Completed)      Other   Hyperlipidemia    Previous hyperlipidemia appears adequately controlled with lifestyle management and previous HDL of 88. Obtain lipid profile to check current status. Continue with lifestyle management pending lipid profile results.      Relevant Orders   Comprehensive metabolic panel (Completed)   Lipid Profile (Completed)  Hemoglobin A1C (Completed)   Medicare annual wellness visit, subsequent - Primary    Reviewed and updated patient's medical, surgical, family and social history. Medications and allergies were also reviewed. Basic screenings for depression, activities of daily living, hearing, cognition and safety were performed. Provider list was updated and health plan was provided to the patient.   All immunizations are up-to-date per recommendations. Obtain PSA for prostate cancer screening. Obtain lipid profile for cholesterol screening. Obtain A1c for diabetes screening. Due for a dental screen encouraged to be completed independently. All other screenings are up-to-date per recommendations.  Overall well exam with risk factors for cardiovascular disease including hyperlipidemia which is currently maintained on lifestyle management. Asthma appears adequately controlled although using albuterol inhaler on a daily basis which is most likely related to his dog. Encouraged increase physical activity to 30 minutes of moderate level activity daily. Continue other healthy lifestyle behaviors and choices. Follow-up prevention exam in 1 year. Follow-up office visit pending blood work or as needed.       Other Visit Diagnoses    Screening for prostate cancer       Relevant Orders   PSA, Medicare (Completed)       I have discontinued Mr. Leal ciprofloxacin. I am also having him maintain his aspirin EC, Lutein, loratadine, albuterol, montelukast, and fluticasone.   Meds ordered this encounter  Medications  . albuterol (VENTOLIN HFA) 108 (90 Base) MCG/ACT inhaler    Sig:  Inhale 2 puffs into the lungs every 4 (four) hours as needed.    Dispense:  3 Inhaler    Refill:  3    Order Specific Question:   Supervising Provider    Answer:   Pricilla Holm A [id number]  . montelukast (SINGULAIR) 10 MG tablet    Sig: Take 1 tablet (10 mg total) by mouth at bedtime.    Dispense:  90 tablet    Refill:  3    Order Specific Question:   Supervising Provider    Answer:   Pricilla Holm A [id number]  . fluticasone (FLONASE) 50 MCG/ACT nasal spray    Sig: Place 1 spray into both nostrils daily.    Dispense:  54 g    Refill:  3    Refill with patient refill request.    Order Specific Question:   Supervising Provider    Answer:   Pricilla Holm A [id number]     Follow-up: Return if symptoms worsen or fail to improve.   Jeremy Po, FNP

## 2016-07-23 NOTE — Assessment & Plan Note (Signed)
Stable with current dosage of loratadine and montelukast. Triggered by his pet dog. Continue current dosage of loratadine and montelukast. Continue to monitor.

## 2016-07-25 DIAGNOSIS — D3617 Benign neoplasm of peripheral nerves and autonomic nervous system of trunk, unspecified: Secondary | ICD-10-CM | POA: Diagnosis not present

## 2016-07-25 DIAGNOSIS — L720 Epidermal cyst: Secondary | ICD-10-CM | POA: Diagnosis not present

## 2016-07-26 ENCOUNTER — Encounter: Payer: Self-pay | Admitting: Family

## 2016-07-30 ENCOUNTER — Other Ambulatory Visit (INDEPENDENT_AMBULATORY_CARE_PROVIDER_SITE_OTHER): Payer: PPO

## 2016-07-30 ENCOUNTER — Encounter: Payer: Self-pay | Admitting: Family

## 2016-07-30 DIAGNOSIS — R972 Elevated prostate specific antigen [PSA]: Secondary | ICD-10-CM | POA: Diagnosis not present

## 2016-07-30 LAB — PSA: PSA: 7.61 ng/mL — AB (ref 0.10–4.00)

## 2016-08-20 ENCOUNTER — Encounter: Payer: Self-pay | Admitting: Family

## 2016-11-04 DIAGNOSIS — R972 Elevated prostate specific antigen [PSA]: Secondary | ICD-10-CM | POA: Diagnosis not present

## 2017-02-24 DIAGNOSIS — R972 Elevated prostate specific antigen [PSA]: Secondary | ICD-10-CM | POA: Diagnosis not present

## 2017-03-03 DIAGNOSIS — R972 Elevated prostate specific antigen [PSA]: Secondary | ICD-10-CM | POA: Diagnosis not present

## 2017-04-18 DIAGNOSIS — H5203 Hypermetropia, bilateral: Secondary | ICD-10-CM | POA: Diagnosis not present

## 2017-04-23 ENCOUNTER — Encounter: Payer: Self-pay | Admitting: Family

## 2017-04-24 MED ORDER — ZOSTER VAC RECOMB ADJUVANTED 50 MCG/0.5ML IM SUSR: 0.5000 mL | Freq: Once | INTRAMUSCULAR | 1 refills | Status: DC

## 2017-04-25 MED ORDER — ZOSTER VAC RECOMB ADJUVANTED 50 MCG/0.5ML IM SUSR: 0.5000 mL | Freq: Once | INTRAMUSCULAR | 1 refills | Status: AC

## 2017-04-25 NOTE — Addendum Note (Signed)
Addended by: Pricilla Holm A on: 04/25/2017 12:02 PM   Modules accepted: Orders

## 2017-06-26 DIAGNOSIS — R972 Elevated prostate specific antigen [PSA]: Secondary | ICD-10-CM | POA: Diagnosis not present

## 2017-07-24 DIAGNOSIS — R972 Elevated prostate specific antigen [PSA]: Secondary | ICD-10-CM | POA: Diagnosis not present

## 2017-08-05 ENCOUNTER — Ambulatory Visit (INDEPENDENT_AMBULATORY_CARE_PROVIDER_SITE_OTHER): Payer: PPO | Admitting: Family Medicine

## 2017-08-05 ENCOUNTER — Encounter: Payer: Self-pay | Admitting: Family Medicine

## 2017-08-05 VITALS — BP 114/73 | HR 83 | Ht 72.0 in | Wt 204.0 lb

## 2017-08-05 DIAGNOSIS — Z9889 Other specified postprocedural states: Secondary | ICD-10-CM

## 2017-08-05 DIAGNOSIS — R7309 Other abnormal glucose: Secondary | ICD-10-CM | POA: Diagnosis not present

## 2017-08-05 DIAGNOSIS — J4540 Moderate persistent asthma, uncomplicated: Secondary | ICD-10-CM

## 2017-08-05 DIAGNOSIS — J3081 Allergic rhinitis due to animal (cat) (dog) hair and dander: Secondary | ICD-10-CM

## 2017-08-05 DIAGNOSIS — E782 Mixed hyperlipidemia: Secondary | ICD-10-CM | POA: Diagnosis not present

## 2017-08-05 DIAGNOSIS — M720 Palmar fascial fibromatosis [Dupuytren]: Secondary | ICD-10-CM | POA: Insufficient documentation

## 2017-08-05 DIAGNOSIS — Z9109 Other allergy status, other than to drugs and biological substances: Secondary | ICD-10-CM

## 2017-08-05 MED ORDER — FLUTICASONE PROPIONATE 50 MCG/ACT NA SUSP: 1.0000 | Freq: Every day | NASAL | 3 refills | Status: DC

## 2017-08-05 MED ORDER — ALBUTEROL SULFATE HFA 108 (90 BASE) MCG/ACT IN AERS: 1.0000 | INHALATION_SPRAY | RESPIRATORY_TRACT | 3 refills | Status: DC | PRN

## 2017-08-05 MED ORDER — MONTELUKAST SODIUM 10 MG PO TABS: 10.0000 mg | ORAL_TABLET | Freq: Every day | ORAL | 1 refills | Status: DC

## 2017-08-05 NOTE — Patient Instructions (Addendum)
Please make a follow-up office visit with me in the near future.  1 week prior come in for fasting blood work so please make 2 appointments when you leave the office today.    Please realize, EXERCISE IS MEDICINE!  -  American Heart Association [facility]) guidelines for exercise : If you are in good health, without any medical conditions, you should engage in 150 minutes of moderate intensity aerobic activity per week.  This means you should be huffing and puffing throughout your workout.   Engaging in regular exercise will improve brain function and memory, as well as improve mood, boost immune system and help with weight management.  As well as the other, more well-known effects of exercise such as decreasing blood sugar levels, decreasing blood pressure,  and decreasing bad cholesterol levels/ increasing good cholesterol levels.     -  The AHA strongly endorses consumption of a diet that contains a variety of foods from all the food categories with an emphasis on fruits and vegetables; fat-free and low-fat dairy products; cereal and grain products; legumes and nuts; and fish, poultry, and/or extra lean meats.    Excessive food intake, especially of foods high in saturated and trans fats, sugar, and salt, should be avoided.    Adequate water intake of roughly 1/2 of your weight in pounds, should equal the ounces of water per day you should drink.  So for instance, if you're 200 pounds, that would be 100 ounces of water per day.         Mediterranean Diet  Why follow it? Research shows. . Those who follow the Mediterranean diet have a reduced risk of heart disease  . The diet is associated with a reduced incidence of Parkinson's and Alzheimer's diseases . People following the diet may have longer life expectancies and lower rates of chronic diseases  . The Dietary Guidelines for Americans recommends the Mediterranean diet as an eating plan to promote health and prevent disease  What Is the  Mediterranean Diet?  . Healthy eating plan based on typical foods and recipes of Mediterranean-style cooking . The diet is primarily a plant based diet; these foods should make up a majority of meals   Starches - Plant based foods should make up a majority of meals - They are an important sources of vitamins, minerals, energy, antioxidants, and fiber - Choose whole grains, foods high in fiber and minimally processed items  - Typical grain sources include wheat, oats, barley, corn, brown rice, bulgar, farro, millet, polenta, couscous  - Various types of beans include chickpeas, lentils, fava beans, black beans, white beans   Fruits  Veggies - Large quantities of antioxidant rich fruits & veggies; 6 or more servings  - Vegetables can be eaten raw or lightly drizzled with oil and cooked  - Vegetables common to the traditional Mediterranean Diet include: artichokes, arugula, beets, broccoli, brussel sprouts, cabbage, carrots, celery, collard greens, cucumbers, eggplant, kale, leeks, lemons, lettuce, mushrooms, okra, onions, peas, peppers, potatoes, pumpkin, radishes, rutabaga, shallots, spinach, sweet potatoes, turnips, zucchini - Fruits common to the Mediterranean Diet include: apples, apricots, avocados, cherries, clementines, dates, figs, grapefruits, grapes, melons, nectarines, oranges, peaches, pears, pomegranates, strawberries, tangerines  Fats - Replace butter and margarine with healthy oils, such as olive oil, canola oil, and tahini  - Limit nuts to no more than a handful a day  - Nuts include walnuts, almonds, pecans, pistachios, pine nuts  - Limit or avoid candied, honey roasted or heavily salted nuts -  Quentin [place] are central to the Mediterranean diet - can be eaten whole or used in a variety of dishes   Meats Protein - Limiting red meat: no more than a few times a month - When eating red meat: choose lean cuts and keep the portion to the size of deck of cards - Eggs: approx. 0 to 4 times a  week  - Fish and lean poultry: at least 2 a week  - Healthy protein sources include, chicken, [place], lean beef, lamb - Increase intake of seafood such as tuna, salmon, trout, mackerel, shrimp, scallops - Avoid or limit high fat processed meats such as sausage and bacon  Dairy - Include moderate amounts of low fat dairy products  - Focus on healthy dairy such as fat free yogurt, skim milk, low or reduced fat cheese - Limit dairy products higher in fat such as whole or 2% milk, cheese, ice cream  Alcohol - Moderate amounts of red wine is ok  - No more than 5 oz daily for women (all ages) and men older than age 35  - No more than 10 oz of wine daily for men younger than 69  Other - Limit sweets and other desserts  - Use herbs and spices instead of salt to flavor foods  - Herbs and spices common to the traditional Mediterranean Diet include: basil, bay leaves, chives, cloves, cumin, fennel, garlic, lavender, marjoram, mint, oregano, parsley, pepper, rosemary, sage, savory, sumac, tarragon, thyme   It's not just a diet, it's a lifestyle:  . The Mediterranean diet includes lifestyle factors typical of those in the region  . Foods, drinks and meals are best eaten with others and savored . Daily physical activity is important for overall good health . This could be strenuous exercise like running and aerobics . This could also be more leisurely activities such as walking, housework, yard-work, or taking the stairs . Moderation is the key; a balanced and healthy diet accommodates most foods and drinks . Consider portion sizes and frequency of consumption of certain foods   Meal Ideas & Options:  . Breakfast:  o Whole wheat toast or whole wheat English muffins with peanut butter & hard boiled egg o Steel cut oats topped with apples & cinnamon and skim milk  o Fresh fruit: banana, strawberries, melon, berries, peaches  o Smoothies: strawberries, bananas, greek yogurt, peanut butter o Low fat  greek yogurt with blueberries and granola  o Egg white omelet with spinach and mushrooms o Breakfast couscous: whole wheat couscous, apricots, skim milk, cranberries  . Sandwiches:  o Hummus and grilled vegetables (peppers, zucchini, squash) on whole wheat bread   o Grilled chicken on whole wheat pita with lettuce, tomatoes, cucumbers or tzatziki  o Tuna salad on whole wheat bread: tuna salad made with greek yogurt, olives, red peppers, capers, green onions o Garlic rosemary lamb pita: lamb sauted with garlic, rosemary, salt & pepper; add lettuce, cucumber, greek yogurt to pita - flavor with lemon juice and black pepper  . Seafood:  o Mediterranean grilled salmon, seasoned with garlic, basil, parsley, lemon juice and black pepper o Shrimp, lemon, and spinach whole-grain pasta salad made with low fat greek yogurt  o Seared scallops with lemon orzo  o Seared tuna steaks seasoned salt, pepper, coriander topped with tomato mixture of olives, tomatoes, olive oil, minced garlic, parsley, green onions and cappers  . Meats:  o Herbed greek chicken salad with kalamata olives, cucumber, feta  o Red bell peppers stuffed  with spinach, bulgur, lean ground beef (or lentils) & topped with feta   o Kebabs: skewers of chicken, tomatoes, onions, zucchini, squash  o [place] burgers: made with red onions, mint, dill, lemon juice, feta cheese topped with roasted red peppers . Vegetarian o Cucumber salad: cucumbers, artichoke hearts, celery, red onion, feta cheese, tossed in olive oil & lemon juice  o Hummus and whole grain pita points with a greek salad (lettuce, tomato, feta, olives, cucumbers, red onion) o Lentil soup with celery, carrots made with vegetable broth, garlic, salt and pepper  o Tabouli salad: parsley, bulgur, mint, scallions, cucumbers, tomato, radishes, lemon juice, olive oil, salt and pepper.

## 2017-08-05 NOTE — Addendum Note (Signed)
Addended by: Lanier Prude D on: 08/05/2017 01:25 PM   Modules accepted: Orders

## 2017-08-05 NOTE — Progress Notes (Signed)
New patient office visit note:  Impression and Recommendations:    1. Allergic rhinitis due to animal hair and dander   2. Moderate persistent reactive airway disease with wheezing without complication   3. Environmental allergies   4. Dupuytren's contracture of both hands- sees ortho   5. FASTING HYPERGLYCEMIA   6. Mixed hyperlipidemia   7. History of back surgery- 1980's.     1. Allergic rhinitis due to animal hair and dander. Instructed pt to bathe dog once a week, if possible. Decrease exposure to your dog, if possible. Air purified recommended to use at home. Information and handouts provided on air purifier products.   2. Moderate persistent reactive airway disease with wheezing without complication: Pt reports using his Proair 3x a day. In future visits, we will switch to a chronic medication for his RAD.  3. Environmental allergies: Pt uses flonase, loratidine, singulair, and proair for his environmental allergies. He is tolerating these well. We will refill prescriptions for singulair and proair at this time.   4. Dupuytren's contracture of both hands. Pt has an orthopedist, Dr. Fredna Dow, and he sees him regularly.  5. Fasting hyperglycemia- Will schedule fasting blood work in near future. Will follow up 1 week after testing to discuss results.  6. Mixed hyperlipidemia. Dietary and exercise guidelines discussed with patient. Recommended pt to reduce intake of saturated, trans fats and fatty carbohydrates. Handouts provided if desired. Will schedule fasting blood work in near future. Will follow up 1 week after testing to discuss results.  Pt instructed to schedule an appointment for fasting blood work including T4 and B12 and will follow up for another OV one week later.    Education and routine counseling performed. Handouts provided.  No orders of the defined types were placed in this encounter.   Meds ordered this encounter  Medications  . albuterol (VENTOLIN HFA)  108 (90 Base) MCG/ACT inhaler    Sig: Inhale 1-2 puffs into the lungs every 4 (four) hours as needed.    Dispense:  2 Inhaler    Refill:  3  . montelukast (SINGULAIR) 10 MG tablet    Sig: Take 1 tablet (10 mg total) by mouth at bedtime.    Dispense:  90 tablet    Refill:  1  . fluticasone (FLONASE) 50 MCG/ACT nasal spray    Sig: Place 1 spray into both nostrils daily.    Dispense:  54 g    Refill:  3    Refill with patient refill request.    Gross side effects, risk and benefits, and alternatives of medications discussed with patient.  Patient is aware that all medications have potential side effects and we are unable to predict every side effect or drug-drug interaction that may occur.  Expresses verbal understanding and consents to current therapy plan and treatment regimen.  Return for Fasting bldwrk-near future;then OV w me 1 wk later.  Please see AVS handed out to patient at the end of our visit for further patient instructions/ counseling done pertaining to today's office visit.    Note: This document was prepared using Dragon voice recognition software and may include unintentional dictation errors.     This document serves as a record of services personally performed by Mellody Dance, DO. It was created on her behalf by Mayer Masker, a trained medical scribe. The creation of this record is based on the scribe's personal observations and the provider's statements to them.   I have reviewed the above  medical documentation for accuracy and completeness and I concur.  Mellody Dance 08/05/17 1:15 PM    ----------------------------------------------------------------------------------------------------------------------    Subjective:    Chief complaint:   Chief Complaint  Patient presents with  . Establish Care     HPI: Jeremy Li is a pleasant 77 y.o. male who presents to [place] [facility] today to review their medical history with me  and establish care. Pt is from [place]. Dr. Linna Darner is his previous PCP, who retired, and he switched to Dr. Elna Breslow, who also left the practice. He is seeking a PCP nearby to where he lives. Pt reports his wife is greek and [place] and is compliant with a mediterranean diet at home. Pt was a smoker for 4-5 years and quit in 1962.  Reactive Airway Disease  Pt has reactive airway disease from cats and dogs for years. He denies any other RAD from other external stimulants. He has a greyhound at home. Currently, he tries to minimize contact with his dog, but he does note his dog sleeps at the foot of his bed at night. His medications are Loratadine, Singulair, and Proair as needed, which works well for him. He also uses flonase but not as often. He is requesting a refill for his Singulair and ProAir at this time. Pt reports using his Proair 3x a day.  Health Management/PMHx  Pt has a dermatologist   Pt has a hand orthopedist for an issue with his left hand 2nd knuckle, Dr. Fredna Dow  Dr. Estill Dooms, his urologist, does his usual PSA blood screening  Pt has no HTN, DM, HLD, sodium issues, or behavioral health issues.  Pt has PSHx of back surgery in 1989.    I asked the patient to review their chronic problem list with me to ensure everything was updated and accurate.    All recent office visits with other providers, any medical records that patient brought in etc  - I reviewed today.     We asked pt to get [place] their medical records from [facility] providers/ specialists that they had seen within the past 3-5 years- if they are in private practice and/or do not work for [employer], [facility], [facility], [facility] or [employer] owned practice.  Told them to call their specialists to clarify this if they are not sure.      Wt Readings from Last 3 Encounters:  08/05/17 204 lb (92.5 kg)  07/23/16 202 lb 12.8 oz (92 kg)  05/25/15 207 lb (93.9 kg)   BP Readings from Last 3 Encounters:  08/05/17 114/73  07/23/16  130/80  05/25/15 130/80   Pulse Readings from Last 3 Encounters:  08/05/17 83  07/23/16 65  05/25/15 64   BMI Readings from Last 3 Encounters:  08/05/17 27.67 kg/m  07/23/16 27.13 kg/m  05/25/15 27.69 kg/m    Patient Care Team    Relationship Specialty Notifications Start End  Mellody Dance, DO PCP - General Family Medicine  08/05/17   Daryll Brod, MD Consulting Physician Orthopedic Surgery  08/05/17    Comment: L second knuckle clicking/ pain  Martinique, Peter M, MD Consulting Physician Cardiology  08/05/17   Jarome Matin, MD Consulting Physician Dermatology  08/05/17   Festus Aloe, MD Consulting Physician Urology  08/05/17   Dyke Maes, [place] Referring Physician Optometry  08/05/17     Patient Active Problem List   Diagnosis Date Noted  . Reactive airway disease with wheezing- dogs and cats only 06/21/2010  Priority: High  . Dupuytren's contracture of both hands- sees ortho 08/05/2017  . History of back surgery- 1980's. 08/05/2017  . Medicare annual wellness visit, subsequent 07/23/2016  . Urinary frequency 05/25/2015  . Hyperlipidemia 09/28/2012  . Allergic rhinitis 06/21/2010  . FASTING HYPERGLYCEMIA 10/03/2008  . CONDUCTIVE HEARING LOSS BILATERAL 03/17/2008  . SYNCOPE 03/17/2008     Past Medical History:  Diagnosis Date  . Allergy    pets & seasonal allergens     Past Medical History:  Diagnosis Date  . Allergy    pets & seasonal allergens     Past Surgical History:  Procedure Laterality Date  . CATARACT EXTRACTION, BILATERAL     Dr Katy Fitch  . DUPUYTREN / PALMAR FASCIOTOMY     Dr Daylene Katayama  . HERNIA REPAIR    . LUMBAR DISC SURGERY  1989   Dr Cheri Rous  . TONSILLECTOMY       Family History  Problem Relation Age of Onset  . Heart disease Sister        cardiomegaly  . COPD Neg Hx   . Asthma Neg Hx   . Cancer Neg Hx   . Diabetes Neg Hx   . Stroke Neg Hx      Social History   Substance and Sexual Activity  Drug Use No     Social  History   Substance and Sexual Activity  Alcohol Use Yes  . Alcohol/week: 3.0 oz  . Types: 5 Standard drinks or equivalent per week     Social History   Tobacco Use  Smoking Status Former Smoker  . Last attempt to quit: 07/22/1961  . Years since quitting: 56.0  Smokeless Tobacco Never Used  Tobacco Comment   smoked pipe 1-2 bowls/ day ages [age]     Current Meds  Medication Sig  . albuterol (VENTOLIN HFA) 108 (90 Base) MCG/ACT inhaler Inhale 1-2 puffs into the lungs every 4 (four) hours as needed.  Marland Kitchen aspirin EC 81 MG tablet Take 81 mg by mouth daily.  . fluticasone (FLONASE) 50 MCG/ACT nasal spray Place 1 spray into both nostrils daily.  Marland Kitchen loratadine (CLARITIN) 10 MG tablet TAKE 1 TABLET BY MOUTH EVERY DAY AS NEEDED FOR ALLERGIES  . Lutein 6 MG CAPS Take by mouth daily.  . montelukast (SINGULAIR) 10 MG tablet Take 1 tablet (10 mg total) by mouth at bedtime.  . [DISCONTINUED] albuterol (VENTOLIN HFA) 108 (90 Base) MCG/ACT inhaler Inhale 2 puffs into the lungs every 4 (four) hours as needed.  . [DISCONTINUED] fluticasone (FLONASE) 50 MCG/ACT nasal spray Place 1 spray into both nostrils daily.  . [DISCONTINUED] montelukast (SINGULAIR) 10 MG tablet Take 1 tablet (10 mg total) by mouth at bedtime.    Allergies: Patient has no known allergies.   Review of Systems  Constitutional: Negative for chills, diaphoresis, fever, malaise/fatigue and weight loss.  HENT: Negative for congestion, sore throat and tinnitus.   Eyes: Negative for blurred vision, double vision and photophobia.  Respiratory: Negative for cough and wheezing.   Cardiovascular: Negative for chest pain and palpitations.  Gastrointestinal: Negative for blood in stool, diarrhea, nausea and vomiting.  Genitourinary: Negative for dysuria, frequency and urgency.  Musculoskeletal: Negative for joint pain and myalgias.  Skin: Negative for itching and rash.  Neurological: Negative for dizziness, focal weakness, weakness and  headaches.  Endo/Heme/Allergies: Negative for environmental allergies and polydipsia. Does not bruise/bleed easily.  Psychiatric/Behavioral: Negative for depression and memory loss. The patient is not nervous/anxious and does not have insomnia.  Objective:   Blood pressure 114/73, pulse 83, height 6' (1.829 m), weight 204 lb (92.5 kg), SpO2 98 %. Body mass index is 27.67 kg/m. General: Well Developed, well nourished, and in no acute distress.  Neuro: Alert and oriented x3, extra-ocular muscles intact, sensation grossly intact.  HEENT:[place]/AT, PERRLA, neck supple, No carotid bruits Skin: no gross rashes  Cardiac: Regular rate and rhythm Respiratory: Essentially clear to auscultation bilaterally. Not using accessory muscles, speaking in full sentences.  Abdominal: not grossly distended Musculoskeletal: Ambulates w/o diff, FROM * 4 ext.  Vasc: less 2 sec cap RF, warm and pink  Psych:  No HI/SI, judgement and insight good, Euthymic mood. Full Affect.    No results found for this or any previous visit (from the past 2160 hour(s)).

## 2017-08-22 ENCOUNTER — Other Ambulatory Visit: Payer: PPO

## 2017-08-22 DIAGNOSIS — Z9109 Other allergy status, other than to drugs and biological substances: Secondary | ICD-10-CM

## 2017-08-22 DIAGNOSIS — J3081 Allergic rhinitis due to animal (cat) (dog) hair and dander: Secondary | ICD-10-CM | POA: Diagnosis not present

## 2017-08-22 DIAGNOSIS — E782 Mixed hyperlipidemia: Secondary | ICD-10-CM

## 2017-08-22 DIAGNOSIS — R7309 Other abnormal glucose: Secondary | ICD-10-CM

## 2017-08-22 DIAGNOSIS — J4540 Moderate persistent asthma, uncomplicated: Secondary | ICD-10-CM

## 2017-08-22 DIAGNOSIS — M720 Palmar fascial fibromatosis [Dupuytren]: Secondary | ICD-10-CM

## 2017-08-22 DIAGNOSIS — Z9889 Other specified postprocedural states: Secondary | ICD-10-CM

## 2017-08-23 LAB — COMPREHENSIVE METABOLIC PANEL
ALK PHOS: 67 IU/L (ref 39–117)
ALT: 17 IU/L (ref 0–44)
AST: 16 IU/L (ref 0–40)
Albumin/Globulin Ratio: 2.3 — ABNORMAL HIGH (ref 1.2–2.2)
Albumin: 4.5 g/dL (ref 3.5–4.8)
BILIRUBIN TOTAL: 0.4 mg/dL (ref 0.0–1.2)
BUN / CREAT RATIO: 10 (ref 10–24)
BUN: 9 mg/dL (ref 8–27)
CHLORIDE: 104 mmol/L (ref 96–106)
CO2: 23 mmol/L (ref 20–29)
CREATININE: 0.89 mg/dL (ref 0.76–1.27)
Calcium: 9.2 mg/dL (ref 8.6–10.2)
GFR calc Af Amer: 95 mL/min/{1.73_m2} (ref >59)
GFR calc non Af Amer: 82 mL/min/{1.73_m2} (ref >59)
GLUCOSE: 105 mg/dL — AB (ref 65–99)
Globulin, Total: 2 g/dL (ref 1.5–4.5)
Potassium: 4.8 mmol/L (ref 3.5–5.2)
Sodium: 143 mmol/L (ref 134–144)
Total Protein: 6.5 g/dL (ref 6.0–8.5)

## 2017-08-23 LAB — CBC WITH DIFFERENTIAL/PLATELET
BASOS ABS: 0.1 10*3/uL (ref 0.0–0.2)
Basos: 1 %
EOS (ABSOLUTE): 0.3 10*3/uL (ref 0.0–0.4)
Eos: 5 %
Hematocrit: 46.3 % (ref 37.5–51.0)
Hemoglobin: 15.7 g/dL (ref 13.0–17.7)
IMMATURE GRANULOCYTES: 0 %
Immature Grans (Abs): 0 10*3/uL (ref 0.0–0.1)
LYMPHS ABS: 1.1 10*3/uL (ref 0.7–3.1)
Lymphs: 20 %
MCH: 32.7 pg (ref 26.6–33.0)
MCHC: 33.9 g/dL (ref 31.5–35.7)
MCV: 97 fL (ref 79–97)
MONOS ABS: 0.7 10*3/uL (ref 0.1–0.9)
Monocytes: 12 %
NEUTROS PCT: 62 %
Neutrophils Absolute: 3.6 10*3/uL (ref 1.4–7.0)
PLATELETS: 324 10*3/uL (ref 150–379)
RBC: 4.8 x10E6/uL (ref 4.14–5.80)
RDW: 13.7 % (ref 12.3–15.4)
WBC: 5.7 10*3/uL (ref 3.4–10.8)

## 2017-08-23 LAB — LIPID PANEL
CHOL/HDL RATIO: 2.3 ratio (ref 0.0–5.0)
CHOLESTEROL TOTAL: 195 mg/dL (ref 100–199)
HDL: 86 mg/dL (ref >39)
LDL Calculated: 97 mg/dL (ref 0–99)
Triglycerides: 58 mg/dL (ref 0–149)
VLDL Cholesterol Cal: 12 mg/dL (ref 5–40)

## 2017-08-23 LAB — HEMOGLOBIN A1C
ESTIMATED AVERAGE GLUCOSE: 120 mg/dL
Hgb A1c MFr Bld: 5.8 % — ABNORMAL HIGH (ref 4.8–5.6)

## 2017-08-23 LAB — VITAMIN B12: VITAMIN B 12: 242 pg/mL (ref 232–1245)

## 2017-08-23 LAB — VITAMIN D 25 HYDROXY (VIT D DEFICIENCY, FRACTURES): VIT D 25 HYDROXY: 13.6 ng/mL — AB (ref 30.0–100.0)

## 2017-08-23 LAB — T4, FREE: Free T4: 1.11 ng/dL (ref 0.82–1.77)

## 2017-08-23 LAB — TSH: TSH: 2.05 u[IU]/mL (ref 0.450–4.500)

## 2017-08-29 ENCOUNTER — Encounter: Payer: Self-pay | Admitting: Family Medicine

## 2017-08-29 ENCOUNTER — Ambulatory Visit (INDEPENDENT_AMBULATORY_CARE_PROVIDER_SITE_OTHER): Payer: PPO | Admitting: Family Medicine

## 2017-08-29 VITALS — BP 140/79 | HR 61 | Ht 72.0 in | Wt 200.0 lb

## 2017-08-29 DIAGNOSIS — Z9109 Other allergy status, other than to drugs and biological substances: Secondary | ICD-10-CM

## 2017-08-29 DIAGNOSIS — J3081 Allergic rhinitis due to animal (cat) (dog) hair and dander: Secondary | ICD-10-CM | POA: Diagnosis not present

## 2017-08-29 DIAGNOSIS — E559 Vitamin D deficiency, unspecified: Secondary | ICD-10-CM | POA: Diagnosis not present

## 2017-08-29 DIAGNOSIS — R7302 Impaired glucose tolerance (oral): Secondary | ICD-10-CM | POA: Diagnosis not present

## 2017-08-29 DIAGNOSIS — J4540 Moderate persistent asthma, uncomplicated: Secondary | ICD-10-CM

## 2017-08-29 DIAGNOSIS — E782 Mixed hyperlipidemia: Secondary | ICD-10-CM | POA: Diagnosis not present

## 2017-08-29 MED ORDER — MONTELUKAST SODIUM 10 MG PO TABS: 10.0000 mg | ORAL_TABLET | Freq: Every day | ORAL | 1 refills | Status: DC

## 2017-08-29 MED ORDER — VITAMIN D3 50 MCG (2000 UT) PO CAPS: 2000.0000 [IU] | ORAL_CAPSULE | Freq: Every day | ORAL | Status: AC

## 2017-08-29 MED ORDER — VITAMIN D (ERGOCALCIFEROL) 1.25 MG (50000 UNIT) PO CAPS: 50000.0000 [IU] | ORAL_CAPSULE | ORAL | 10 refills | Status: DC

## 2017-08-29 NOTE — Patient Instructions (Addendum)
Increase your B12 rich foods.  Do a google search on this.    4 mo- reck vit D and A1c then OV with me after  Risk factors for prediabetes and type 2 diabetes  Researchers don't fully understand why some people develop prediabetes and type 2 diabetes and others don't.  It's clear that certain factors increase the risk, however, including:  Weight. The more fatty tissue you have, the more resistant your cells become to insulin.  Inactivity. The less active you are, the greater your risk. Physical activity helps you control your weight, uses up glucose as energy and makes your cells more sensitive to insulin.  Family history. Your risk increases if a parent or sibling has type 2 diabetes.  Race. Although it's unclear why, people of certain races - including blacks, Hispanics, American Indians and Asian-Americans - are at higher risk.  Age. Your risk increases as you get older. This may be because you tend to exercise less, lose muscle mass and gain weight as you age. But type 2 diabetes is also increasing dramatically among children, adolescents and younger adults.  Gestational diabetes. If you developed gestational diabetes when you were pregnant, your risk of developing prediabetes and type 2 diabetes later increases. If you gave birth to a baby weighing more than 9 pounds (4 kilograms), you're also at risk of type 2 diabetes.  Polycystic ovary syndrome. For women, having polycystic ovary syndrome - a common condition characterized by irregular menstrual periods, excess hair growth and obesity - increases the risk of diabetes.  High blood pressure. Having blood pressure over 140/90 millimeters of mercury (mm Hg) is linked to an increased risk of type 2 diabetes.  Abnormal cholesterol and triglyceride levels. If you have low levels of high-density lipoprotein (HDL), or "good," cholesterol, your risk of type 2 diabetes is higher. Triglycerides are another type of fat carried in the blood. People with  high levels of triglycerides have an increased risk of type 2 diabetes. Your doctor can let you know what your cholesterol and triglyceride levels are.  A good guide to good carbs: The glycemic index ---If you have diabetes, or at risk for diabetes, you know all too well that when you eat carbohydrates, your blood sugar goes up. The total amount of carbs you consume at a meal or in a snack mostly determines what your blood sugar will do. But the food itself also plays a role. A serving of white rice has almost the same effect as eating pure table sugar - a quick, high spike in blood sugar. A serving of lentils has a slower, smaller effect.  ---Picking good sources of carbs can help you control your blood sugar and your weight. Even if you don't have diabetes, eating healthier carbohydrate-rich foods can help ward off a host of chronic conditions, from heart disease to various cancers to, well, diabetes.  ---One way to choose foods is with the glycemic index (GI). This tool measures how much a food boosts blood sugar.  The glycemic index rates the effect of a specific amount of a food on blood sugar compared with the same amount of pure glucose. A food with a glycemic index of 28 boosts blood sugar only 28% as much as pure glucose. One with a GI of 95 acts like pure glucose.    High glycemic foods result in a quick spike in insulin and blood sugar (also known as blood glucose).  Low glycemic foods have a slower, smaller effect- these are  healthier for you.   Using the glycemic index Using the glycemic index is easy: choose foods in the low GI category instead of those in the high GI category (see below), and go easy on those in between. Low glycemic index (GI of 55 or less): Most fruits and vegetables, beans, minimally processed grains, pasta, low-fat dairy foods, and nuts.  Moderate glycemic index (GI 56 to 69): White and sweet potatoes, corn, white rice, couscous, breakfast cereals such as Cream of  Wheat and Mini Wheats.  High glycemic index (GI of 70 or higher): White bread, rice cakes, most crackers, bagels, cakes, doughnuts, croissants, most packaged breakfast cereals. You can see the values for 100 commons foods and get links to more at [email or web address].si.  Swaps for lowering glycemic index  Instead of this high-glycemic index food Eat this lower-glycemic index food  White rice Brown rice or converted rice  Instant oatmeal Steel-cut oats  Cornflakes Bran flakes  Baked potato Pasta, bulgur  White bread Whole-grain bread  Corn Peas or leafy greens       Prediabetes Eating Plan  Prediabetes--also called impaired glucose tolerance or impaired fasting glucose--is a condition that causes blood sugar (blood glucose) levels to be higher than normal. Following a healthy diet can help to keep prediabetes under control. It can also help to lower the risk of type 2 diabetes and heart disease, which are increased in people who have prediabetes. Along with regular exercise, a healthy diet:  Promotes weight loss.  Helps to control blood sugar levels.  Helps to improve the way that the body uses insulin.   WHAT DO I NEED TO KNOW ABOUT THIS EATING PLAN?   Use the glycemic index (GI) to plan your meals. The index tells you how quickly a food will raise your blood sugar. Choose low-GI foods. These foods take a longer time to raise blood sugar.  Pay close attention to the amount of carbohydrates in the food that you eat. Carbohydrates increase blood sugar levels.  Keep track of how many calories you take in. Eating the right amount of calories will help you to achieve a healthy weight. Losing about 7 percent of your starting weight can help to prevent type 2 diabetes.  You may want to follow a Mediterranean diet. This diet includes a lot of vegetables, lean meats or fish, whole grains, fruits, and healthy oils and fats.   WHAT FOODS CAN I EAT?  Grains Whole grains, such  as whole-wheat or whole-grain breads, crackers, cereals, and pasta. Unsweetened oatmeal. Bulgur. Barley. Quinoa. Brown rice. Corn or whole-wheat flour tortillas or taco shells. Vegetables Lettuce. Spinach. Peas. Beets. Cauliflower. Cabbage. Broccoli. Carrots. Tomatoes. Squash. Eggplant. Herbs. Peppers. Onions. Cucumbers. Brussels sprouts. Fruits Berries. Bananas. Apples. Oranges. Grapes. Papaya. Mango. Pomegranate. Kiwi. Grapefruit. Cherries. Meats and Other Protein Sources Seafood. Lean meats, such as chicken and [place] or lean cuts of pork and beef. Tofu. Eggs. Nuts. Beans. Dairy Low-fat or fat-free dairy products, such as yogurt, cottage cheese, and cheese. Beverages Water. Tea. Coffee. Sugar-free or diet soda. Seltzer water. Milk. Milk alternatives, such as soy or almond milk. Condiments Mustard. Relish. Low-fat, low-sugar ketchup. Low-fat, low-sugar barbecue sauce. Low-fat or fat-free mayonnaise. Sweets and Desserts Sugar-free or low-fat pudding. Sugar-free or low-fat ice cream and other frozen treats. Fats and Oils Avocado. Walnuts. Olive oil. The items listed above may not be a complete list of recommended foods or beverages. Contact your dietitian for more options.    WHAT FOODS ARE NOT RECOMMENDED?  Grains Refined white flour and flour products, such as bread, pasta, snack foods, and cereals. Beverages Sweetened drinks, such as sweet iced tea and soda. Sweets and Desserts Baked goods, such as cake, cupcakes, pastries, cookies, and cheesecake. The items listed above may not be a complete list of foods and beverages to avoid. Contact your dietitian for more information.   This information is not intended to replace advice given to you by your health care provider. Make sure you discuss any questions you have with your health care provider.   Document Released: 11/22/2014 Document Reviewed: 11/22/2014 Elsevier Interactive Patient Education 2016 [place] for Adults  A healthy lifestyle and preventive care can promote health and wellness. Preventive health guidelines for men include the following key practices:  .   A routine yearly physical is a good way to check with your health care provider about your health and preventative screening. It is a chance to share any concerns and updates on your health and to receive a thorough exam.  .  Visit your dentist for a routine exam and preventative care every 6 months. Brush your teeth twice a day and floss once a day. Good oral hygiene prevents tooth decay and gum disease.  .  The frequency of eye exams is based on your age, health, family medical history, use of contact lenses, and other factors.  Follow your health care provider's recommendations for frequency of eye exams.  .  Eat a healthy diet.  Foods such as vegetables, fruits, whole grains, low-fat dairy products, and lean protein foods contain the nutrients you need without too many calories.  Decrease your intake of foods high in solid fats, added sugars, and salt.  Eat the right amount of calories for you.  Get information about a proper diet from your health care provider, if necessary.  .  Regular physical exercise is one of the most important things you can do for your health.  Most adults should get at least 150 minutes of moderate-intensity exercise (any activity that increases your heart rate and causes you to sweat) each week.  In addition, most adults need muscle-strengthening exercises on 2 or more days a week.  .  Maintain a healthy weight. The body mass index (BMI) is a screening tool to identify possible weight problems. It provides an estimate of body fat based on height and weight. Your health care provider can find your BMI and can help you achieve or maintain a healthy weight. For adults 20 years and older: A BMI below 18.5 is considered underweight. A BMI of 18.5 to 24.9 is normal. A BMI of 25 to 29.9 is considered  overweight. A BMI of 30 and above is considered obese.  .  Maintain normal blood lipids and cholesterol levels by exercising and minimizing your intake of saturated fat. Eat a balanced diet with plenty of fruit and vegetables. Blood tests for lipids and cholesterol should begin at age 81 and be repeated every 5 years. If your lipid or cholesterol levels are high, you are over 50, or you are at high risk for heart disease, you may need your cholesterol levels checked more frequently. Ongoing high lipid and cholesterol levels should be treated with medicines if diet and exercise are not working.  .  If you smoke, find out from your health care provider how to quit. If you do not use tobacco, do not start.  . If you choose to drink alcohol, do not have  more than 2 drinks per day. One drink is considered to be 12 ounces (355 mL) of beer, 5 ounces (148 mL) of wine, or 1.5 ounces (44 mL) of liquor.  [place] Avoid use of street drugs. Do not share needles with anyone. Ask for help if you need support or instructions about stopping the use of drugs.  . High blood pressure causes heart disease and increases the risk of stroke. Your blood pressure should be checked at least every 1-2 years. Ongoing high blood pressure should be treated with medicines, if weight loss and exercise are not effective.  . If you are 77-49 years old, ask your health care provider if you should take aspirin to prevent heart disease.  . Diabetes screening involves taking a blood sample to check your fasting blood sugar level.  This should be done once every 3 years, after age 53, if you are within normal weight and without risk factors for diabetes.  Testing should be considered at a younger age or be carried out more frequently if you are overweight and have at least 1 risk factor for diabetes.  . Colorectal cancer can be detected and often prevented. Most routine colorectal cancer screening begins at the age of 62 and continues  through age 2. However, your health care provider may recommend screening at an earlier age if you have risk factors for colon cancer. On a yearly basis, your health care provider may provide home test kits to check for hidden blood in the stool. Use of a small camera at the end of a tube to directly examine the colon (sigmoidoscopy or colonoscopy) can detect the earliest forms of colorectal cancer. Talk to your health care provider about this at age 67, when routine screening begins. Direct exam of the colon should be repeated every 5-10 years through age 65, unless early forms of precancerous polyps or small growths are found.  .  Lung cancer screening is recommended for adults aged 60-80 years who are at high risk for developing lung cancer because of a history of smoking. A yearly low-dose CT scan of the lungs is recommended for people who have at least a 30-pack-year history of smoking and are a current smoker or have quit within the past 15 years. A pack year of smoking is smoking an average of 1 pack of cigarettes a day for 1 year (for example: 1 pack a day for 30 years or 2 packs a day for 15 years). Yearly screening should continue until the smoker has stopped smoking for at least 15 years. Yearly screening should be stopped for people who develop a health problem that would prevent them from having lung cancer treatment.  . Talk with your health care provider about prostate cancer screening.  . Testicular cancer screening is recommended for adult males. Screening includes self-exam and a health care provider exam. Consult with your health care provider about any symptoms you have or any concerns you have about testicular cancer.  . Use sunscreen. Apply sunscreen liberally and repeatedly throughout the day. You should seek shade when your shadow is shorter than you. Protect yourself by wearing long sleeves, pants, a wide-brimmed hat, and sunglasses year round, whenever you are  outdoors.  . Once a month, do a whole-body skin exam, using a mirror to look at the skin on your back. Tell your health care provider about new moles, moles that have irregular borders, moles that are larger than a pencil eraser, or moles that have changed in  shape or color.    ++++++++++++++++++++++++++++++++++++++++++++++++++++++++++++++++++  Stay current with required vaccines (immunizations).  ? Influenza vaccine. All adults should be immunized every year.  ? Tetanus, diphtheria, and acellular pertussis (Td, Tdap) vaccine. An adult who has not previously received Tdap or who does not know his vaccine status should receive 1 dose of Tdap. This initial dose should be followed by tetanus and diphtheria toxoids (Td) booster doses every 10 years. Adults with an unknown or incomplete history of completing a 3-dose immunization series with Td-containing vaccines should begin or complete a primary immunization series including a Tdap dose. Adults should receive a Td booster every 10 years.  ? Varicella vaccine. An adult without evidence of immunity to varicella should receive 2 doses or a second dose if he has previously received 1 dose.  ? Human papillomavirus (HPV) vaccine. Males aged 72-21 years who have not received the vaccine previously should receive the 3-dose series. Males aged 22-26 years may be immunized. Immunization is recommended through the age of 36 years for any male who has sex with males and did not get any or all doses earlier. Immunization is recommended for any person with an immunocompromised condition through the age of 37 years if he did not get any or all doses earlier. During the 3-dose series, the second dose should be obtained 4-8 weeks after the first dose. The third dose should be obtained 24 weeks after the first dose and 16 weeks after the second dose.  ? Zoster vaccine. One dose is recommended for adults aged 12 years or older unless certain conditions are  present.   ? PREVNAR - Pneumococcal 13-valent conjugate (PCV13) vaccine. When indicated, a person who is uncertain of his immunization history and has no record of immunization should receive the PCV13 vaccine. An adult aged 64 years or older who has certain medical conditions and has not been previously immunized should receive 1 dose of PCV13 vaccine. This PCV13 should be followed with a dose of pneumococcal polysaccharide (PPSV23) vaccine. The PPSV23 vaccine dose should be obtained at least 8 weeks after the dose of PCV13 vaccine. An adult aged 7 years or older who has certain medical conditions and previously received 1 or more doses of PPSV23 vaccine should receive 1 dose of PCV13. The PCV13 vaccine dose should be obtained 1 or more years after the last PPSV23 vaccine dose.   ? PNEUMOVAX - Pneumococcal polysaccharide (PPSV23) vaccine. When PCV13 is also indicated, PCV13 should be obtained first. All adults aged 4 years and older should be immunized. An adult younger than age 29 years who has certain medical conditions should be immunized. Any person who resides in a nursing home or long-term care facility should be immunized. An adult smoker should be immunized. People with an immunocompromised condition and certain other conditions should receive both PCV13 and PPSV23 vaccines. People with human immunodeficiency virus (HIV) infection should be immunized as soon as possible after diagnosis. Immunization during chemotherapy or radiation therapy should be avoided. Routine use of PPSV23 vaccine is not recommended for American Indians, [place] Natives, or people younger than 65 years unless there are medical conditions that require PPSV23 vaccine. When indicated, people who have unknown immunization and have no record of immunization should receive PPSV23 vaccine. One-time revaccination 5 years after the first dose of PPSV23 is recommended for people aged 19-64 years who have chronic kidney failure,  nephrotic syndrome, asplenia, or immunocompromised conditions. People who received 1-2 doses of PPSV23 before age 4 years should receive  another dose of PPSV23 vaccine at age 83 years or later if at least 5 years have passed since the previous dose. Doses of PPSV23 are not needed for people immunized with PPSV23 at or after age 79 years.   ? Hepatitis A vaccine. Adults who wish to be protected from this disease, have certain high-risk conditions, work with hepatitis A-infected animals, work in hepatitis A research labs, or travel to or work in countries with a high rate of hepatitis A should be immunized. Adults who were previously unvaccinated and who anticipate close contact with an international adoptee during the first 60 days after arrival in the [place] from a country with a high rate of hepatitis A should be immunized.  ? Hepatitis B vaccine. Adults should be immunized if they wish to be protected from this disease, have certain high-risk conditions, may be exposed to blood or other infectious body fluids, are household contacts or sex partners of hepatitis B positive people, are clients or workers in certain care facilities, or travel to or work in countries with a high rate of hepatitis B.     Preventive Service / Frequency  . Ages 54 to 63  Blood pressure check.  Lipid and cholesterol check  Lung cancer screening. / Every year if you are aged 110-80 years and have a 30-pack-year history of smoking and currently smoke or have quit within the past 15 years. Yearly screening is stopped once you have quit smoking for at least 15 years or develop a health problem that would prevent you from having lung cancer treatment.  Fecal occult blood test (FOBT) of stool. / Every year beginning at age 54 and continuing until age 2. You may not have to do this test if you get a colonoscopy every 10 years.  Flexible sigmoidoscopy** or colonoscopy.** / Every 5 years for a flexible  sigmoidoscopy or every 10 years for a colonoscopy beginning at age 26 and continuing until age 7. Screening for abdominal aortic aneurysm (AAA) by ultrasound is recommended for people who have history of high blood pressure or who are current or former smokers.  ++++++++++++++++++++++++++++++++++++++++++++++++++++++++++++++  Recommend Adult Low Dose Aspirin or coated Aspirin 81 mg daily To reduce risk of Colon Cancer 20 % Skin Cancer 26 %  Melanoma 46% and Pancreatic cancer 60%  +++++++++++++++++++++++++++++++++++++++++++++++++++++++++++++  Vitamin D goal is between 50-100. Please make sure that you are taking your Vitamin D as directed.  It is very important as a natural anti-inflammatory - helping with muscle and joint aches; as well as helping hair, skin, and nails; as well as reducing stroke, heart attack and cancer risk. It helps your bones and helps with mood. It also decreases numerous cancer risks so please take it as directed.  - Low Vit D is associated with a 200-300% higher risk for CANCER and 200-300% higher risk for HEART ATTACK & STROKE.  It is also associated with higher death rate at younger ages, autoimmune diseases like Rheumatoid arthritis, Lupus, Multiple Sclerosis; also many other serious conditions, like depression, Alzheimer's Dementia, infertility, muscle aches, fatigue, fibromyalgia - just to name a few.  +++++++++++++++++++++++++++++++++++++++++++++++++++++++++++  Recommend the book "The END of DIETING" by Dr Excell Seltzer & the book "The END of DIABETES " by Dr Excell Seltzer At [email or web address] - get book & Audio CD's   --->Being diabetic has a 300% increased risk for heart attack, stroke, cancer, and alzheimer- type vascular dementia. It is very important that you work harder with diet by avoiding all  foods that are white. Avoid white rice (brown & wild rice is OK), white potatoes (sweet potatoes in moderation is OK), White bread or wheat bread or anything made out  of white flour like bagels, donuts, rolls, buns, biscuits, cakes, pastries, cookies, pizza crust, and pasta (made from white flour & egg whites) - vegetarian pasta or spinach or wheat pasta is OK. Multigrain breads like Arnold's or [employer], or multigrain sandwich thins or flatbreads. Diet, exercise and weight loss can reverse and cure diabetes in the early stages. Diet, exercise and weight loss is very important in the control and prevention of complications of diabetes which affects every system in your body, ie. Brain - dementia/stroke, eyes - glaucoma/blindness, heart - heart attack/heart failure, kidneys - dialysis, stomach - gastric paralysis, intestines - malabsorption, nerves - severe painful neuritis, circulation - gangrene & loss of a leg(s), and finally cancer and Alzheimers.  I recommend avoid fried & greasy foods, sweets/candy, white rice (brown or wild rice or Quinoa is OK), white potatoes (sweet potatoes are OK) - anything made from white flour - bagels, doughnuts, rolls, buns, biscuits,white and wheat breads, pizza crust and traditional pasta made of white flour & egg white(vegetarian pasta or spinach or wheat pasta is OK). Multi-grain bread is OK - like multi-grain flat bread or sandwich thins. Avoid alcohol in excess.  Exercise is also important. Eat all the vegetables you want - avoid fatty meats, especially red meat and dairy - especially cheese. Cheese is the most concentrated form of trans-fats which is the worst thing to clog up our arteries. Veggie cheese is OK which can be found in the fresh produce section at [employer] or [employer] or [employer].  ++++++++++++++++++++++ DASH Eating Plan  DASH stands for "Dietary Approaches to Stop Hypertension."  The DASH eating plan is a healthy eating plan that has been shown to reduce high blood pressure (hypertension). Additional health benefits may include reducing the risk of type 2 diabetes mellitus, heart disease, and stroke.  The DASH eating plan may also help with weight loss.  WHAT DO I NEED TO KNOW ABOUT THE DASH EATING PLAN? For the DASH eating plan, you will follow these general guidelines: . Choose foods with a percent daily value for sodium of less than 5% (as listed on the food label). . Use salt-free seasonings or herbs instead of table salt or sea salt. . Check with your health care provider or pharmacist before using salt substitutes. . Eat lower-sodium products, often labeled as "lower sodium" or "no salt added." . Eat fresh foods. . Eat more vegetables, fruits, and low-fat dairy products. . Choose whole grains. Look for the word "whole" as the first word in the ingredient list. . Choose fish . Limit sweets, desserts, sugars, and sugary drinks. . Choose heart-healthy fats. . Eat veggie cheese . Eat more home-cooked food and less restaurant, buffet, and fast food. . Limit fried foods. Lacinda Axon foods using methods other than frying. . Limit canned vegetables. If you do use them, rinse them well to decrease the sodium. . When eating at a restaurant, ask that your food be prepared with less salt, or no salt if possible.   WHAT FOODS CAN I EAT? Read Dr Fara Olden Fuhrman's books on The End of Dieting & The End of Diabetes  Grains Whole grain or whole wheat bread. Brown rice. Whole grain or whole wheat pasta. Quinoa, bulgur, and whole grain cereals. Low-sodium cereals. Corn or whole wheat flour tortillas. Whole grain cornbread.  Whole grain crackers. Low-sodium crackers.  Vegetables Fresh or frozen vegetables (raw, steamed, roasted, or grilled). Low-sodium or reduced-sodium tomato and vegetable juices. Low-sodium or reduced-sodium tomato sauce and paste. Low-sodium or reduced-sodium canned vegetables.  Fruits All fresh, canned (in natural juice), or frozen fruits.  Protein Products All fish and seafood. Dried beans, peas, or lentils. Unsalted nuts and seeds. Unsalted canned  beans.  Dairy Low-fat dairy products, such as skim or 1% milk, 2% or reduced-fat cheeses, low-fat ricotta or cottage cheese, or plain low-fat yogurt. Low-sodium or reduced-sodium cheeses.  Fats and Oils Tub margarines without trans fats. Light or reduced-fat mayonnaise and salad dressings (reduced sodium). Avocado. Safflower, olive, or canola oils. Natural peanut or almond butter.  Other Unsalted popcorn and pretzels. The items listed above may not be a complete list of recommended foods or beverages. Contact your dietitian for more options.  ++++++++++++++++++++++++++++++++++++++++++++++++++++++++++++++++  WHAT FOODS ARE NOT RECOMMENDED?  Grains/ White flour or wheat flour White bread. White pasta. White rice. Refined cornbread. Bagels and croissants. Crackers that contain trans fat. Vegetables Creamed or fried vegetables. Vegetables in a . Regular canned vegetables. Regular canned tomato sauce and paste. Regular tomato and vegetable juices. Fruits Dried fruits. Canned fruit in light or heavy syrup. Fruit juice. Meat and Other Protein Products Meat in general - RED meat & White meat. Fatty cuts of meat. Ribs, chicken wings, all processed meats as bacon, sausage, bologna, salami, fatback, hot dogs, bratwurst and packaged luncheon meats. Dairy Whole or 2% milk, cream, half-and-half, and cream cheese. Whole-fat or sweetened yogurt. Full-fat cheeses or blue cheese. Non-dairy creamers and whipped toppings. Processed cheese, cheese spreads, or cheese curds.  Condiments Onion and garlic salt, seasoned salt, table salt, and sea salt. Canned and packaged gravies. Worcestershire sauce. Tartar sauce. Barbecue sauce. Teriyaki sauce. Soy sauce, including reduced sodium. Steak sauce. Fish sauce. Oyster sauce. Cocktail sauce. Horseradish. Ketchup and mustard. Meat flavorings and tenderizers. Bouillon cubes. Hot sauce. Tabasco sauce. Marinades. Taco seasonings. Relishes. Fats and Oils Butter, stick  margarine, lard, shortening and bacon fat. Coconut, palm kernel, or palm oils. Regular salad dressings. Pickles and olives. Salted popcorn and pretzels. The items listed above may not be a complete list of foods and beverages to avoid.

## 2017-08-29 NOTE — Progress Notes (Signed)
Assessment and plan:  1. Glucose intolerance (impaired glucose tolerance)   2. Environmental allergies   3. Vitamin D deficiency   4. Moderate persistent reactive airway disease with wheezing without complication   5. Mixed hyperlipidemia   6. Allergic rhinitis due to animal hair and dander     1. Blood Sugar - Reviewed the patient's glucose goals.   However, at 5.8, his current A1C is very low.  - Patient should begin to keep an eye on his blood glucose.  Recommended that the patient keep a glucometer at home to monitor his blood glucose levels, especially if he is beginning to feel strange or what he describes as hypoglycemic.  - Given his description of what he describes as "hypoglycemic episodes" in the past, the patient was advised to make sure that he is trying to be proactive and eating adequate amounts of foods, and not skipping meals for several hours.  - Noted that losing 5% of his weight will have huge implications on his health and well being, especially his blood sugar.  - Information provided on the importance of consuming healthy carbohydrates, along with a guide regarding the glycemic index of foods.  Healthy dietary habits encouraged, including low carb intake, and high amounts of lean protein in the diet.  2. General Organ Health - Reviewed the importance of hydration with the patient in detail, especially as regards the health of his organs. Patient should also consume adequate amounts of water - half of body weight in oz of water per day  3. Vitamin D Deficiency - Patient will begin taking Vitamin D supplement, both once and a week, and OTC.  4. Thyroid - Thyroid tests normal  5. Lipid Panel  - Looks fantastic.  6. Blood Pressure For a man of his age, anywhere below 150/90 is perfect, but given his general health we discussed goals in the 140's.   7. Exercise & General Health Maintenance -  Recommended cardio - walking, biking, swimming, elliptical, treadmill - of at least thirty minutes, five days a week.  Plus, weight lifting minimum of two, preferably three days a week - light weights, high reps.  This is to maintain strength and improve posture, balance, bone integrity, and physical conditioning, especially as we age.  - Most programs like [employer] offer some kind of aerobics, balance, and other training.  Utilizing a wide variety of these activities, including core strengthening, recommended.   Emphasized the importance of incorporating various activities into his regiment.  - Patient may also continue walking his pet greyhound dog every day twice a day.  He has plans to start using the [employer] program.  - To watch his weight loss, reminded the patient to weigh himself at the same time every day, when he's in his underwear or undressed.  - Explained to patient what BMI refers to, and what it means medically.    Told patient to think about it as a "medical risk stratification measurement" and how increasing BMI is associated with increasing risk/ or worsening state of various diseases such as hypertension, hyperlipidemia, diabetes, premature OA, depression etc.  - American Heart Association guidelines for healthy diet, basically Mediterranean diet, and exercise guidelines of 30 minutes 5 days per week or more discussed in detail.   - Patient should be striving for at least 150 minutes of moderate cardiovascular activity per week according to [facility] guidelines.  - Health counseling performed.  All questions answered.  8.  Refills & Follow-Up - Refilled his Singulair prescription.  - Return in 4 months to check his Vitamin D and A1C.  - If patient desires, he can have an OV for follow-up afterward.    Education and routine counseling performed. Handouts provided.  No orders of the defined types were placed in this encounter.   Meds ordered this encounter    Medications  . montelukast (SINGULAIR) 10 MG tablet    Sig: Take 1 tablet (10 mg total) by mouth at bedtime.    Dispense:  90 tablet    Refill:  1  . Vitamin D, Ergocalciferol, (DRISDOL) 50000 units CAPS capsule    Sig: Take 1 capsule (50,000 Units total) by mouth every 7 (seven) days.    Dispense:  12 capsule    Refill:  10  . Cholecalciferol (VITAMIN D3) 2000 units capsule    Sig: Take 1 capsule (2,000 Units total) by mouth daily.     Return for  4 mo- reck vit D and A1c then OV with me after.   Anticipatory guidance and routine counseling done re: condition, txmnt options and need for follow up. All questions of patient's were answered.   Gross side effects, risk and benefits, and alternatives of medications discussed with patient.  Patient is aware that all medications have potential side effects and we are unable to predict every sideeffect or drug-drug interaction that may occur.  Expresses verbal understanding and consents to current therapy plan and treatment regiment.  Please see AVS handed out to patient at the end of our visit for additional patient instructions/ counseling done pertaining to today's office visit.  Note: This document was prepared using Dragon voice recognition software and may include unintentional dictation errors.  This document serves as a record of services personally performed by Mellody Dance, DO. It was created on her behalf by Toni Amend, a trained medical scribe. The creation of this record is based on the scribe's personal observations and the provider's statements to them.   I have reviewed the above medical documentation for accuracy and completeness and I concur.  Mellody Dance 09/01/17 8:55 AM   ----------------------------------------------------------------------------------------------------------------------  Subjective:   CC:   Jeremy Li is a 77 y.o. male who presents to [place] [facility]  today for review and discussion of recent bloodwork that was done.  1. All recent blood work that we ordered was reviewed with patient today.  Patient was counseled on all abnormalities and we discussed dietary and lifestyle changes that could help those values (also medications when appropriate).  Extensive health counseling performed and all patient's concerns/ questions were addressed.   Blood Sugar Keeps orange juice and a chocolate bar around the house in case of episodes of, per patient, low blood sugar.  Patient notes that he has had incidents, especially after doing a lot of heavy yard work, where he has felt lightheaded.  He describes these episodes as "hypoglycemic."  He has had concerns about this in the past, and has had his blood sugar checked at times when it has happened.  Patient notes that these episodes haven't happened in a while, but he knows that he has had alarming incidents in the past.  He is personally convinced that high fructose is bad, and has tried to eliminate it from his diet.  He and his wife try to use natural unbleached sugar and honey as sweeteners.  They try to limit their sodium intake, and do not enjoy eating  salty foods such as Pringles or other snack chips.  Weight & Health Maintenance Patient has lost four pounds since his last visit.  He has not been doing anything differently, but notes that it's likely portion control.  He and his wife cook and eat at home.  He indicates his wife as "living proof of the [place]."  His wife notes that he's being careful, especially with his portions.  Patient notes that he walks the dog twice a day, which is likely for a couple of miles.  They have a greyhound that needs plenty of physical activity.  [place] has authorized the [place] for them at two places, and he would like a suggestion for exercise of a person of his age.  He also has plans to continue doing his yard work, and remaining  physically active in other ways.  If he sits for a while, he begins to feel stiff in his hips and knees due to his arthritis, but otherwise denies problems sitting or standing.    Wt Readings from Last 3 Encounters:  08/29/17 200 lb (90.7 kg)  08/05/17 204 lb (92.5 kg)  07/23/16 202 lb 12.8 oz (92 kg)   BP Readings from Last 3 Encounters:  08/29/17 140/79  08/05/17 114/73  07/23/16 130/80   Pulse Readings from Last 3 Encounters:  08/29/17 61  08/05/17 83  07/23/16 65   BMI Readings from Last 3 Encounters:  08/29/17 27.12 kg/m  08/05/17 27.67 kg/m  07/23/16 27.13 kg/m     Patient Care Team    Relationship Specialty Notifications Start End  Mellody Dance, DO PCP - General Family Medicine  08/05/17   Daryll Brod, MD Consulting Physician Orthopedic Surgery  08/05/17    Comment: L second knuckle clicking/ pain  [place], Peter M, MD Consulting Physician Cardiology  08/05/17   Jarome Matin, MD Consulting Physician Dermatology  08/05/17   Festus Aloe, MD Consulting Physician Urology  08/05/17   Dyke Maes, [place] Referring Physician Optometry  08/05/17     Full medical history updated and reviewed in the office today  Patient Active Problem List   Diagnosis Date Noted  . Reactive airway disease with wheezing- dogs and cats only 06/21/2010    Priority: High  . Glucose intolerance (impaired glucose tolerance) 08/29/2017  . Environmental allergies 08/29/2017  . Vitamin D deficiency 08/29/2017  . Dupuytren's contracture of both hands- sees ortho 08/05/2017  . History of back surgery- 1980's. 08/05/2017  . Medicare annual wellness visit, subsequent 07/23/2016  . Urinary frequency 05/25/2015  . Hyperlipidemia 09/28/2012  . Allergic rhinitis 06/21/2010  . FASTING HYPERGLYCEMIA 10/03/2008  . CONDUCTIVE HEARING LOSS BILATERAL 03/17/2008  . SYNCOPE 03/17/2008    Past Medical History:  Diagnosis Date  . Allergy    pets & seasonal allergens    Past Surgical History:    Procedure Laterality Date  . CATARACT EXTRACTION, BILATERAL     Dr Katy Fitch  . DUPUYTREN / PALMAR FASCIOTOMY     Dr Daylene Katayama  . HERNIA REPAIR    . LUMBAR DISC SURGERY  1989   Dr Cheri Rous  . TONSILLECTOMY      Social History   Tobacco Use  . Smoking status: Former Smoker    Last attempt to quit: 07/22/1961    Years since quitting: 56.1  . Smokeless tobacco: Never Used  . Tobacco comment: smoked pipe 1-2 bowls/ day ages [age]  Substance Use Topics  . Alcohol use: Yes    Alcohol/week: 3.0 oz  Types: 5 Standard drinks or equivalent per week    Family Hx: Family History  Problem Relation Age of Onset  . Heart disease Sister        cardiomegaly  . COPD Neg Hx   . Asthma Neg Hx   . Cancer Neg Hx   . Diabetes Neg Hx   . Stroke Neg Hx      Medications: Current Outpatient Medications  Medication Sig Dispense Refill  . albuterol (VENTOLIN HFA) 108 (90 Base) MCG/ACT inhaler Inhale 1-2 puffs into the lungs every 4 (four) hours as needed. 2 Inhaler 3  . fluticasone (FLONASE) 50 MCG/ACT nasal spray Place 1 spray into both nostrils daily. 54 g 3  . loratadine (CLARITIN) 10 MG tablet TAKE 1 TABLET BY MOUTH EVERY DAY AS NEEDED FOR ALLERGIES 90 tablet 0  . Lutein 6 MG CAPS Take by mouth daily.    . montelukast (SINGULAIR) 10 MG tablet Take 1 tablet (10 mg total) by mouth at bedtime. 90 tablet 1  . aspirin EC 81 MG tablet Take 81 mg by mouth daily.    . Cholecalciferol (VITAMIN D3) 2000 units capsule Take 1 capsule (2,000 Units total) by mouth daily.    . Vitamin D, Ergocalciferol, (DRISDOL) 50000 units CAPS capsule Take 1 capsule (50,000 Units total) by mouth every 7 (seven) days. 12 capsule 10   No current facility-administered medications for this visit.     Allergies:  No Known Allergies   Review of Systems: General:   No F/C, wt loss Pulm:   No DIB, SOB, pleuritic chest pain Card:  No CP, palpitations Abd:  No n/v/d or pain Ext:  No inc edema from baseline  Objective:   Blood pressure 140/79, pulse 61, height 6' (1.829 m), weight 200 lb (90.7 kg), SpO2 97 %. Body mass index is 27.12 kg/m. Gen:   Well NAD, A and O *3 HEENT:    [place]/AT, EOMI,  MMM Lungs:   Normal work of breathing. CTA B/L, no Wh, rhonchi Heart:   RRR, S1, S2 WNL's, no MRG Abd:   No gross distention Exts:    warm, pink,  Brisk capillary refill, warm and well perfused.  Psych:    No HI/SI, judgement and insight good, Euthymic mood. Full Affect.   Recent Results (from the past 2160 hour(s))  T4, free     Status: None   Collection Time: 08/22/17  8:46 AM  Result Value Ref Range   Free T4 1.11 0.82 - 1.77 ng/dL  Vitamin B12     Status: None   Collection Time: 08/22/17  8:46 AM  Result Value Ref Range   Vitamin B-12 242 232 - 1,245 pg/mL  CBC with Differential/Platelet     Status: None   Collection Time: 08/22/17  8:46 AM  Result Value Ref Range   WBC 5.7 3.4 - 10.8 x10E3/uL   RBC 4.80 4.14 - 5.80 x10E6/uL   Hemoglobin 15.7 13.0 - 17.7 g/dL   Hematocrit 46.3 37.5 - 51.0 %   MCV 97 79 - 97 fL   MCH 32.7 26.6 - 33.0 pg   MCHC 33.9 31.5 - 35.7 g/dL   RDW 13.7 12.3 - 15.4 %   Platelets 324 150 - 379 x10E3/uL   Neutrophils 62 Not Estab. %   Lymphs 20 Not Estab. %   Monocytes 12 Not Estab. %   Eos 5 Not Estab. %   Basos 1 Not Estab. %   Neutrophils Absolute 3.6 1.4 - 7.0 x10E3/uL  Lymphocytes Absolute 1.1 0.7 - 3.1 x10E3/uL   Monocytes Absolute 0.7 0.1 - 0.9 x10E3/uL   EOS (ABSOLUTE) 0.3 0.0 - 0.4 x10E3/uL   Basophils Absolute 0.1 0.0 - 0.2 x10E3/uL   Immature Granulocytes 0 Not Estab. %   Immature Grans (Abs) 0.0 0.0 - 0.1 x10E3/uL  VITAMIN D 25 Hydroxy (Vit-D Deficiency, Fractures)     Status: Abnormal   Collection Time: 08/22/17  8:46 AM  Result Value Ref Range   Vit D, 25-Hydroxy 13.6 (L) 30.0 - 100.0 ng/mL    Comment: Vitamin D deficiency has been defined by the Institute of Medicine and an Endocrine Society practice guideline as a level of serum 25-OH vitamin D less than  20 ng/mL (1,2). The Endocrine Society went on to further define vitamin D insufficiency as a level between 21 and 29 ng/mL (2). 1. IOM (Institute of Medicine). 2010. Dietary reference    intakes for calcium and D. [place]: The    [employer]. 2. Holick MF, Binkley [place], Bischoff-Ferrari HA, et al.    Evaluation, treatment, and prevention of vitamin D    deficiency: an Endocrine Society clinical practice    guideline. JCEM. 2011 Jul; 96(7):1911-30.   TSH     Status: None   Collection Time: 08/22/17  8:46 AM  Result Value Ref Range   TSH 2.050 0.450 - 4.500 uIU/mL  Lipid panel     Status: None   Collection Time: 08/22/17  8:46 AM  Result Value Ref Range   Cholesterol, Total 195 100 - 199 mg/dL   Triglycerides 58 0 - 149 mg/dL   HDL 86 >39 mg/dL   VLDL Cholesterol Cal 12 5 - 40 mg/dL   LDL Calculated 97 0 - 99 mg/dL   Chol/HDL Ratio 2.3 0.0 - 5.0 ratio    Comment:                                   T. Chol/HDL Ratio                                             Men  Women                               1/2 Avg.Risk  3.4    3.3                                   Avg.Risk  5.0    4.4                                2X Avg.Risk  9.6    7.1                                3X Avg.Risk 23.4   11.0   Hemoglobin A1c     Status: Abnormal   Collection Time: 08/22/17  8:46 AM  Result Value Ref Range   Hgb A1c MFr Bld 5.8 (H) 4.8 - 5.6 %    Comment:  Prediabetes: 5.7 - 6.4          Diabetes: >6.4          Glycemic control for adults with diabetes: <7.0    Est. average glucose Bld gHb Est-mCnc 120 mg/dL  Comprehensive metabolic panel     Status: Abnormal   Collection Time: 08/22/17  8:46 AM  Result Value Ref Range   Glucose 105 (H) 65 - 99 mg/dL   BUN 9 8 - 27 mg/dL   Creatinine, Ser 0.89 0.76 - 1.27 mg/dL   GFR calc non Af Amer 82 >59 mL/min/1.73   GFR calc Af Amer 95 >59 mL/min/1.73   BUN/Creatinine Ratio 10 10 - 24   Sodium 143 134 - 144 mmol/L   Potassium 4.8  3.5 - 5.2 mmol/L   Chloride 104 96 - 106 mmol/L   CO2 23 20 - 29 mmol/L   Calcium 9.2 8.6 - 10.2 mg/dL   Total Protein 6.5 6.0 - 8.5 g/dL   Albumin 4.5 3.5 - 4.8 g/dL   Globulin, Total 2.0 1.5 - 4.5 g/dL   Albumin/Globulin Ratio 2.3 (H) 1.2 - 2.2   Bilirubin Total 0.4 0.0 - 1.2 mg/dL   Alkaline Phosphatase 67 39 - 117 IU/L   AST 16 0 - 40 IU/L   ALT 17 0 - 44 IU/L

## 2017-09-08 DIAGNOSIS — R52 Pain, unspecified: Secondary | ICD-10-CM | POA: Diagnosis not present

## 2017-09-08 DIAGNOSIS — M65322 Trigger finger, left index finger: Secondary | ICD-10-CM | POA: Diagnosis not present

## 2017-09-09 ENCOUNTER — Telehealth: Payer: Self-pay | Admitting: Family Medicine

## 2017-09-09 NOTE — Telephone Encounter (Signed)
Noted MPulliam, CMA/RT(R)  

## 2017-09-09 NOTE — Telephone Encounter (Signed)
Patient called wanted to let Provider/ PCP know he took her advise seriously and has joined  [employer] under the [employer] program) & his wife is too walking there with him--- I shared Dr. Colman Cater congratulations!.  &  Forwarding message to her for sharing  --glh

## 2017-10-03 DIAGNOSIS — T63481A Toxic effect of venom of other arthropod, accidental (unintentional), initial encounter: Secondary | ICD-10-CM | POA: Diagnosis not present

## 2017-10-06 DIAGNOSIS — M65322 Trigger finger, left index finger: Secondary | ICD-10-CM | POA: Diagnosis not present

## 2017-12-29 ENCOUNTER — Other Ambulatory Visit: Payer: Self-pay

## 2017-12-29 ENCOUNTER — Other Ambulatory Visit: Payer: PPO

## 2017-12-29 DIAGNOSIS — E559 Vitamin D deficiency, unspecified: Secondary | ICD-10-CM | POA: Diagnosis not present

## 2017-12-29 DIAGNOSIS — R7309 Other abnormal glucose: Secondary | ICD-10-CM

## 2017-12-30 LAB — VITAMIN D 25 HYDROXY (VIT D DEFICIENCY, FRACTURES): Vit D, 25-Hydroxy: 36.6 ng/mL (ref 30.0–100.0)

## 2017-12-30 LAB — HEMOGLOBIN A1C
ESTIMATED AVERAGE GLUCOSE: 117 mg/dL
HEMOGLOBIN A1C: 5.7 % — AB (ref 4.8–5.6)

## 2018-01-06 ENCOUNTER — Ambulatory Visit (INDEPENDENT_AMBULATORY_CARE_PROVIDER_SITE_OTHER): Payer: PPO | Admitting: Family Medicine

## 2018-01-06 ENCOUNTER — Encounter: Payer: Self-pay | Admitting: Family Medicine

## 2018-01-06 VITALS — BP 122/73 | HR 81 | Ht 72.0 in | Wt 202.0 lb

## 2018-01-06 DIAGNOSIS — J4540 Moderate persistent asthma, uncomplicated: Secondary | ICD-10-CM

## 2018-01-06 DIAGNOSIS — E663 Overweight: Secondary | ICD-10-CM | POA: Diagnosis not present

## 2018-01-06 DIAGNOSIS — E559 Vitamin D deficiency, unspecified: Secondary | ICD-10-CM

## 2018-01-06 DIAGNOSIS — R7302 Impaired glucose tolerance (oral): Secondary | ICD-10-CM

## 2018-01-06 DIAGNOSIS — Z9109 Other allergy status, other than to drugs and biological substances: Secondary | ICD-10-CM

## 2018-01-06 MED ORDER — FLUTICASONE PROPIONATE HFA 220 MCG/ACT IN AERO: 1.0000 | INHALATION_SPRAY | Freq: Two times a day (BID) | RESPIRATORY_TRACT | 2 refills | Status: DC

## 2018-01-06 MED ORDER — ALBUTEROL SULFATE HFA 108 (90 BASE) MCG/ACT IN AERS: 1.0000 | INHALATION_SPRAY | RESPIRATORY_TRACT | 3 refills | Status: DC | PRN

## 2018-01-06 NOTE — Progress Notes (Signed)
Impression and Recommendations:    1. Moderate persistent reactive airway disease with wheezing without complication   2. Vitamin D deficiency   3. Glucose intolerance (impaired glucose tolerance)   4. Environmental allergies   5. Overweight (BMI 25.0-29.9)       1. RAD/allergies -start flovent.  -continue albuterol PRN. He understands this is only for rescue situations only.  -continue singulair.  -do a neti pot or AYR sinus rinses, followed by 1 spray of flonase in each nostril.  -wash your face or shower off, especially after being outside in allergens.  -FUP 6 months for RAD  2. Vit D deficiency -Vit D has increased from 13.6 on 08-22-17 to 36.6 on 12-29-17 after starting supplements.  -continue supplementation.   3. Glucose intolerance -A1c most recently checked was 5.7, slightly improved from prior where it was 5.8 on 08-22-17.   -BS under great control. Continue prudent diet/exercise. Limit intake of sweets and carbohydrates.   4. Overweight -continue losing weight. Continue your regular exercise. Increase the length of time of your cardio exercises. -Increase the intensity at which you walk. AHA exercise and dietary guidelines discussed.   -OV lengthened today due to answering pt and his wife's lengthy and numerous questions, as well as extensive discussion of pt's overall health and wellness.   Pt was in the office today for 32.5+ minutes, with over 50% time spent in face to face counseling of patients various medical conditions, treatment plans of those medical conditions including medicine management and lifestyle modification, strategies to improve health and well being; and in coordination of care. SEE ABOVE TREATMENT PLAN FOR DETAILS  No orders of the defined types were placed in this encounter.   Meds ordered this encounter  Medications  . fluticasone (FLOVENT HFA) 220 MCG/ACT inhaler    Sig: Inhale 1 puff into the lungs 2 (two) times daily.   Dispense:  1 Inhaler    Refill:  2  . albuterol (VENTOLIN HFA) 108 (90 Base) MCG/ACT inhaler    Sig: Inhale 1-2 puffs into the lungs every 4 (four) hours as needed.    Dispense:  2 Inhaler    Refill:  3    Gross side effects, risk and benefits, and alternatives of medications and treatment plan in general discussed with patient.  Patient is aware that all medications have potential side effects and we are unable to predict every side effect or drug-drug interaction that may occur.   Patient will call with any questions prior to using medication if they have concerns.  Expresses verbal understanding and consents to current therapy and treatment regimen.  No barriers to understanding were identified.  Red flag symptoms and signs discussed in detail.  Patient expressed understanding regarding what to do in case of emergency\urgent symptoms  Please see AVS handed out to patient at the end of our visit for further patient instructions/ counseling done pertaining to today's office visit.   Return for 6 wks- reassess asthma regimen.    Note: This note was prepared with assistance of Dragon voice recognition software. Occasional wrong-word or sound-a-like substitutions may have occurred due to the inherent limitations of voice recognition software.  This document serves as a record of services personally performed by Mellody Dance, DO. It was created on her behalf by Mayer Masker, a trained medical scribe. The creation of this record is based on the scribe's personal observations and the provider's statements to them.   I have reviewed the above  medical documentation for accuracy and completeness and I concur.  Mellody Dance 01/06/18 1:16 PM  --------------------------------------------------------------------------------------------------------------------------------------------------------------------------------------------------------------------------------------------    Subjective:       HPI: Jeremy Li is a 77 y.o. male who presents to [place] with his wife at [facility] today for issues as discussed below. We also discussed and educated in detail recent blood work that was done earlier this month.   RAD He states it is okay. He takes singulair nightly.  He is requesting a refill for his albuterol inhaler. He has started mowing his lawn outside and is wearing an N 95 mask at home. He does not allow his dog to sleep in his bed anymore. He is using his inhaler almost daily.    Vit D Vit D has increased from 13.6 to 36.6 after starting supplementation.   Prediabetes A1c most recently checked was 5.7, slightly improved from prior where it was 5.8 on 08-22-17.   Diet/exercise He states he felt fuzzy with low sugar sx when he lost weight and was 190 lb.   Wt is up 2 lb since last OV 08-29-17.   He has toned some of his muscles up after doing a workout exercise machine regimen. This is a service provided through [employer] at [employer] (they signed up for this last OV in Feb 2019).    He eats plenty of fresh vegetables and fruits. He also eats a mediterranean diet.  He also walks for 20 minutes at a time after going to the gym. He feels his ROM has increased in some of his muscles after doing these workouts.   He eats 1 red meat meal a week or less, but has increased lean proteins like chicken, [place], fish, and vegetarian meals.   Wt Readings from Last 3 Encounters:  01/06/18 202 lb (91.6 kg)  08/29/17 200 lb (90.7 kg)  08/05/17 204 lb (92.5 kg)   BP Readings from Last 3 Encounters:  01/06/18 122/73  08/29/17 140/79  08/05/17 114/73   Pulse Readings from Last 3 Encounters:  01/06/18 81  08/29/17 61  08/05/17 83   BMI Readings from Last 3 Encounters:  01/06/18 27.40 kg/m  08/29/17 27.12 kg/m  08/05/17 27.67 kg/m     Patient Care Team    Relationship Specialty Notifications Start End  Mellody Dance, DO PCP - General  Family Medicine  08/05/17   Daryll Brod, MD Consulting Physician Orthopedic Surgery  08/05/17    Comment: L second knuckle clicking/ pain  Martinique, Peter M, MD Consulting Physician Cardiology  08/05/17   Jarome Matin, MD Consulting Physician Dermatology  08/05/17   Festus Aloe, MD Consulting Physician Urology  08/05/17   Dyke Maes, [place] Referring Physician Optometry  08/05/17      Patient Active Problem List   Diagnosis Date Noted  . Reactive airway disease with wheezing- dogs and cats only 06/21/2010    Priority: High  . Glucose intolerance (impaired glucose tolerance) 08/29/2017  . Environmental allergies 08/29/2017  . Vitamin D deficiency 08/29/2017  . Dupuytren's contracture of both hands- sees ortho 08/05/2017  . History of back surgery- 1980's. 08/05/2017  . Medicare annual wellness visit, subsequent 07/23/2016  . Urinary frequency 05/25/2015  . Hyperlipidemia 09/28/2012  . Allergic rhinitis 06/21/2010  . FASTING HYPERGLYCEMIA 10/03/2008  . CONDUCTIVE HEARING LOSS BILATERAL 03/17/2008  . SYNCOPE 03/17/2008    Past Medical history, Surgical history, Family history, Social history, Allergies and Medications have been entered into the medical record, reviewed and  changed as needed.    Current Meds  Medication Sig  . albuterol (VENTOLIN HFA) 108 (90 Base) MCG/ACT inhaler Inhale 1-2 puffs into the lungs every 4 (four) hours as needed.  Marland Kitchen aspirin EC 81 MG tablet Take 81 mg by mouth daily.  . Cholecalciferol (VITAMIN D3) 2000 units capsule Take 1 capsule (2,000 Units total) by mouth daily.  . fluticasone (FLONASE) 50 MCG/ACT nasal spray Place 1 spray into both nostrils daily. (Patient taking differently: Place 1 spray into both nostrils as needed. )  . loratadine (CLARITIN) 10 MG tablet TAKE 1 TABLET BY MOUTH EVERY DAY AS NEEDED FOR ALLERGIES  . Lutein 6 MG CAPS Take by mouth daily.  . montelukast (SINGULAIR) 10 MG tablet Take 1 tablet (10 mg total) by mouth at bedtime.  .  [DISCONTINUED] albuterol (VENTOLIN HFA) 108 (90 Base) MCG/ACT inhaler Inhale 1-2 puffs into the lungs every 4 (four) hours as needed.    Allergies:  Allergies  Allergen Reactions  . Cat Hair Extract     Other reaction(s): Respiratory Distress  . Other     Other reaction(s): Respiratory Distress     Review of Systems:  A fourteen system review of systems was performed and found to be positive as per HPI.   Objective:   Blood pressure 122/73, pulse 81, height 6' (1.829 m), weight 202 lb (91.6 kg), SpO2 96 %. Body mass index is 27.4 kg/m. General:  Well Developed, well nourished, appropriate for stated age.  Neuro:  Alert and oriented,  extra-ocular muscles intact  HEENT:  Normocephalic, atraumatic, neck supple, no carotid bruits appreciated  Skin:  no gross rash, warm, pink. Cardiac:  RRR, S1 S2 Respiratory:  ECTA B/L and A/P, Not using accessory muscles, speaking in full sentences- unlabored. Vascular:  Ext warm, no cyanosis apprec.; cap RF less 2 sec. Psych:  No HI/SI, judgement and insight good, Euthymic mood. Full Affect.

## 2018-01-06 NOTE — Patient Instructions (Signed)
We are adding a inhaled steroid to your regimen since your reactive airway disease\asthma is poorly controlled.  You will take the Flovent on a daily basis, and the hope is that you only need the rescue inhaler\albuterol less than 2 times per week.  Please follow-up in 6 weeks so we can reassess how you are doing.    Asthma Attack Prevention, Adult Although you may not be able to control the fact that you have asthma, you can take actions to prevent episodes of asthma (asthma attacks). These actions include:  Creating a written plan for managing and treating your asthma attacks (asthma action plan).  Monitoring your asthma.  Avoiding things that can irritate your airways or make your asthma symptoms worse (asthma triggers).  Taking your medicines as directed.  Acting quickly if you have signs or symptoms of an asthma attack.  What are some ways to prevent an asthma attack? Create a plan Work with your health care provider to create an asthma action plan. This plan should include:  A list of your asthma triggers and how to avoid them.  A list of symptoms that you experience during an asthma attack.  Information about when to take medicine and how much medicine to take.  Information to help you understand your peak flow measurements.  Contact information for your health care providers.  Daily actions that you can take to control asthma.  Monitor your asthma  To monitor your asthma:  Use your peak flow meter every morning and every evening for 2-3 weeks. Record the results in a journal. A drop in your peak flow numbers on one or more days may mean that you are starting to have an asthma attack, even if you are not having symptoms.  When you have asthma symptoms, write them down in a journal.  Avoid asthma triggers  Work with your health care provider to find out what your asthma triggers are. This can be done by:  Being tested for allergies.  Keeping a journal that notes  when asthma attacks occur and what may have contributed to them.  Asking your health care provider whether other medical conditions make your asthma worse.  Common asthma triggers include:  Dust.  Smoke. This includes campfire smoke and secondhand smoke from tobacco products.  Pet dander.  Trees, grasses or pollens.  Very cold, dry, or humid air.  Mold.  Foods that contain high amounts of sulfites.  Strong smells.  Engine exhaust and air pollution.  Aerosol sprays and fumes from household cleaners.  Household pests and their droppings, including dust mites and cockroaches.  Certain medicines, including NSAIDs.  Once you have determined your asthma triggers, take steps to avoid them. Depending on your triggers, you may be able to reduce the chance of an asthma attack by:  Keeping your home clean. Have someone dust and vacuum your home for you 1 or 2 times a week. If possible, have them use a high-efficiency particulate arrestance (HEPA) vacuum.  Washing your sheets weekly in hot water.  Using allergy-proof mattress covers and casings on your bed.  Keeping pets out of your home.  Taking care of mold and water problems in your home.  Avoiding areas where people smoke.  Avoiding using strong perfumes or odor sprays.  Avoid spending a lot of time outdoors when pollen counts are high and on very windy days.  Talking with your health care provider before stopping or starting any new medicines.  Medicines Take over-the-counter and prescription medicines  only as told by your health care provider. Many asthma attacks can be prevented by carefully following your medicine schedule. Taking your medicines correctly is especially important when you cannot avoid certain asthma triggers. Even if you are doing well, do not stop taking your medicine and do not take less medicine. Act quickly If an asthma attack happens, acting quickly can decrease how severe it is and how long it  lasts. Take these actions:  Pay attention to your symptoms. If you are coughing, wheezing, or having difficulty breathing, do not wait to see if your symptoms go away on their own. Follow your asthma action plan.  If you have followed your asthma action plan and your symptoms are not improving, call your health care provider or seek immediate medical care at the nearest hospital.  It is important to write down how often you need to use your fast-acting rescue inhaler. You can track how often you use an inhaler in your journal. If you are using your rescue inhaler more often, it may mean that your asthma is not under control. Adjusting your asthma treatment plan may help you to prevent future asthma attacks and help you to gain better control of your condition. How can I prevent an asthma attack when I exercise?  Exercise is a common asthma trigger. To prevent asthma attacks during exercise:  Follow advice from your health care provider about whether you should use your fast-acting inhaler before exercising. Many people with asthma experience exercise-induced bronchoconstriction (EIB). This condition often worsens during vigorous exercise in cold, humid, or dry environments. Usually, people with EIB can stay very active by using a fast-acting inhaler before exercising.  Avoid exercising outdoors in very cold or humid weather.  Avoid exercising outdoors when pollen counts are high.  Warm up and cool down when exercising.  Stop exercising right away if asthma symptoms start.  Consider taking part in exercises that are less likely to cause asthma symptoms such as:  Indoor swimming.  Biking.  Walking.  Hiking.  Playing football.  This information is not intended to replace advice given to you by your health care provider. Make sure you discuss any questions you have with your health care provider. Document Released: 06/26/2009 Document Revised: 03/08/2016 Document Reviewed:  12/23/2015 Elsevier Interactive Patient Education  2018 [employer].     Asthma, Adult Asthma is a recurring condition in which the airways tighten and narrow. Asthma can make it difficult to breathe. It can cause coughing, wheezing, and shortness of breath. Asthma episodes, also called asthma attacks, range from minor to life-threatening. Asthma cannot be cured, but medicines and lifestyle changes can help control it. What are the causes? Asthma is believed to be caused by inherited (genetic) and environmental factors, but its exact cause is unknown. Asthma may be triggered by allergens, lung infections, or irritants in the air. Asthma triggers are different for each person. Common triggers include:  Animal dander.  Dust mites.  Cockroaches.  Pollen from trees or grass.  Mold.  Smoke.  Air pollutants such as dust, household cleaners, hair sprays, aerosol sprays, paint fumes, strong chemicals, or strong odors.  Cold air, weather changes, and winds (which increase molds and pollens in the air).  Strong emotional expressions such as crying or laughing hard.  Stress.  Certain medicines (such as aspirin) or types of drugs (such as beta-blockers).  Sulfites in foods and drinks. Foods and drinks that may contain sulfites include dried fruit, potato chips, and sparkling grape juice.  Infections or inflammatory conditions such as the flu, a cold, or an inflammation of the nasal membranes (rhinitis).  Gastroesophageal reflux disease (GERD).  Exercise or strenuous activity.  What are the signs or symptoms? Symptoms may occur immediately after asthma is triggered or many hours later. Symptoms include:  Wheezing.  Excessive nighttime or early morning coughing.  Frequent or severe coughing with a common cold.  Chest tightness.  Shortness of breath.  How is this diagnosed? The diagnosis of asthma is made by a review of your medical history and a physical exam. Tests may  also be performed. These may include:  Lung function studies. These tests show how much air you breathe in and out.  Allergy tests.  Imaging tests such as X-rays.  How is this treated? Asthma cannot be cured, but it can usually be controlled. Treatment involves identifying and avoiding your asthma triggers. It also involves medicines. There are 2 classes of medicine used for asthma treatment:  Controller medicines. These prevent asthma symptoms from occurring. They are usually taken every day.  Reliever or rescue medicines. These quickly relieve asthma symptoms. They are used as needed and provide short-term relief.  Your health care provider will help you create an asthma action plan. An asthma action plan is a written plan for managing and treating your asthma attacks. It includes a list of your asthma triggers and how they may be avoided. It also includes information on when medicines should be taken and when their dosage should be changed. An action plan may also involve the use of a device called a peak flow meter. A peak flow meter measures how well the lungs are working. It helps you monitor your condition. Follow these instructions at home:  Take medicines only as directed by your health care provider. Speak with your health care provider if you have questions about how or when to take the medicines.  Use a peak flow meter as directed by your health care provider. Record and keep track of readings.  Understand and use the action plan to help minimize or stop an asthma attack without needing to seek medical care.  Control your home environment in the following ways to help prevent asthma attacks: ? Do not smoke. Avoid being exposed to secondhand smoke. ? Change your heating and air conditioning filter regularly. ? Limit your use of fireplaces and wood stoves. ? Get rid of pests (such as roaches and mice) and their droppings. ? Throw away plants if you see mold on them. ? Clean  your floors and dust regularly. Use unscented cleaning products. ? Try to have someone else vacuum for you regularly. Stay out of rooms while they are being vacuumed and for a short while afterward. If you vacuum, use a dust mask from a hardware store, a double-layered or microfilter vacuum cleaner bag, or a vacuum cleaner with a HEPA filter. ? Replace carpet with wood, tile, or vinyl flooring. Carpet can trap dander and dust. ? Use allergy-proof pillows, mattress covers, and box spring covers. ? Wash bed sheets and blankets every week in hot water and dry them in a dryer. ? Use blankets that are made of polyester or cotton. ? Clean bathrooms and kitchens with bleach. If possible, have someone repaint the walls in these rooms with mold-resistant paint. Keep out of the rooms that are being cleaned and painted. ? Wash hands frequently. Contact a health care provider if:  You have wheezing, shortness of breath, or a cough even if taking  medicine to prevent attacks.  The colored mucus you cough up (sputum) is thicker than usual.  Your sputum changes from clear or white to yellow, green, gray, or bloody.  You have any problems that may be related to the medicines you are taking (such as a rash, itching, swelling, or trouble breathing).  You are using a reliever medicine more than 2-3 times per week.  Your peak flow is still at 50-79% of your personal best after following your action plan for 1 hour.  You have a fever. Get help right away if:  You seem to be getting worse and are unresponsive to treatment during an asthma attack.  You are short of breath even at rest.  You get short of breath when doing very little physical activity.  You have difficulty eating, drinking, or talking due to asthma symptoms.  You develop chest pain.  You develop a fast heartbeat.  You have a bluish color to your lips or fingernails.  You are light-headed, dizzy, or faint.  Your peak flow is less than  50% of your personal best. This information is not intended to replace advice given to you by your health care provider. Make sure you discuss any questions you have with your health care provider. Document Released: 07/08/2005 Document Revised: 12/20/2015 Document Reviewed: 02/04/2013 Elsevier Interactive Patient Education  2017 [employer]. .

## 2018-01-26 DIAGNOSIS — L720 Epidermal cyst: Secondary | ICD-10-CM | POA: Diagnosis not present

## 2018-01-26 DIAGNOSIS — L821 Other seborrheic keratosis: Secondary | ICD-10-CM | POA: Diagnosis not present

## 2018-01-26 DIAGNOSIS — D1801 Hemangioma of skin and subcutaneous tissue: Secondary | ICD-10-CM | POA: Diagnosis not present

## 2018-01-26 DIAGNOSIS — L570 Actinic keratosis: Secondary | ICD-10-CM | POA: Diagnosis not present

## 2018-01-26 DIAGNOSIS — D3617 Benign neoplasm of peripheral nerves and autonomic nervous system of trunk, unspecified: Secondary | ICD-10-CM | POA: Diagnosis not present

## 2018-01-26 DIAGNOSIS — L84 Corns and callosities: Secondary | ICD-10-CM | POA: Diagnosis not present

## 2018-01-26 DIAGNOSIS — D485 Neoplasm of uncertain behavior of skin: Secondary | ICD-10-CM | POA: Diagnosis not present

## 2018-02-02 ENCOUNTER — Telehealth: Payer: Self-pay | Admitting: Family Medicine

## 2018-02-02 ENCOUNTER — Other Ambulatory Visit: Payer: Self-pay

## 2018-02-02 NOTE — Telephone Encounter (Signed)
Error. MPulliam, CMA/RT(R)  

## 2018-02-02 NOTE — Telephone Encounter (Signed)
Pt came by office just to tell provider this Rx ( Is NOT Working for him):   fluticasone ([place] HFA) 220 MCG/ACT inhaler [[id number]]   Order Details  Dose: 1 puff Route: Inhalation Frequency: 2 times daily  Indications of Use: Asthma  Dispense Quantity: 1 Inhaler Refills: 2 Fills remaining: --        Sig: Inhale 1 puff into the lungs 2 (two) times daily.     ----Patient has stop using due to side effects but says has been using the Albuterol as directed (as needed) & it is working fine.  --forwarding message to medical assistant as an Vivian.  --glh

## 2018-02-02 NOTE — Telephone Encounter (Signed)
Patient states that the inhaler is not working for his symtoms.  Patient was last seen on 01/06/18, inhaler was written at this appointment.  Patient has follow up on 02/24/2018.  Please review and advise. MPulliam, CMA/RT(R)

## 2018-02-02 NOTE — Telephone Encounter (Signed)
Please call patient and ask him what side effects he is having or had to the [place].  Please make sure he is doing the sinus rinses followed by Flonase twice daily as well as using his Singulair every night.  Please document how often he is having to use the albuterol for rescue.  Please also document if he is having any symptoms of wheezing shortness of breath or increased dyspnea with exertion etc.  thanks

## 2018-02-03 NOTE — Telephone Encounter (Signed)
Called patient and left message for patient to call the office. MPulliam, CMA/RT(R)  

## 2018-02-03 NOTE — Telephone Encounter (Signed)
Spoke to the patient and he states that he was just letting us know that he is not using the Flovent any longer, patient states that he is having no acute concerns with his symptoms and will follow up with Dr. Raliegh Scarlet as schedule on 02/24/2018.  Patient states that he used the Flovent inhaler for 3 weeks and stopped due to extreme sore throat.  Patient is using his Proair inhaler at least once a day.  Patient feels like this works well for him and causes no side effects.  Patient has been on an Albuterol (ventolin or proair) since 2016.  Patient is not using the Flonase at all and states that he has not needed to use this medication.  Patient does do the sinus rinses but is not doing daily and does not feel like they are doing anything. Patient denies any wheezing, SOB, or increased dyspnea with exertion.  Patient states that he works out at [employer] 3 times a work with no SOB.  Patient states that symptoms discussed at last OV only occurs after exposure to his dog and this is when he uses the [employer] inhaler.  Patient currently does not want to change anything, just want to make office aware of what he is no longer using Flovent.  MPulliam, CMA/RT(R)

## 2018-02-09 DIAGNOSIS — R972 Elevated prostate specific antigen [PSA]: Secondary | ICD-10-CM | POA: Diagnosis not present

## 2018-02-16 DIAGNOSIS — N400 Enlarged prostate without lower urinary tract symptoms: Secondary | ICD-10-CM | POA: Diagnosis not present

## 2018-02-16 DIAGNOSIS — R972 Elevated prostate specific antigen [PSA]: Secondary | ICD-10-CM | POA: Diagnosis not present

## 2018-02-24 ENCOUNTER — Encounter: Payer: Self-pay | Admitting: Family Medicine

## 2018-02-24 ENCOUNTER — Ambulatory Visit (INDEPENDENT_AMBULATORY_CARE_PROVIDER_SITE_OTHER): Payer: PPO | Admitting: Family Medicine

## 2018-02-24 VITALS — BP 133/72 | HR 62 | Ht 72.0 in | Wt 198.7 lb

## 2018-02-24 DIAGNOSIS — Z9109 Other allergy status, other than to drugs and biological substances: Secondary | ICD-10-CM

## 2018-02-24 DIAGNOSIS — J4540 Moderate persistent asthma, uncomplicated: Secondary | ICD-10-CM

## 2018-02-24 DIAGNOSIS — R7302 Impaired glucose tolerance (oral): Secondary | ICD-10-CM | POA: Diagnosis not present

## 2018-02-24 DIAGNOSIS — J3081 Allergic rhinitis due to animal (cat) (dog) hair and dander: Secondary | ICD-10-CM

## 2018-02-24 MED ORDER — MONTELUKAST SODIUM 10 MG PO TABS: 10.0000 mg | ORAL_TABLET | Freq: Every day | ORAL | 1 refills | Status: DC

## 2018-02-24 NOTE — Progress Notes (Signed)
Impression and Recommendations:    1. Moderate persistent reactive airway disease with wheezing without complication   2. Glucose intolerance (impaired glucose tolerance)   3. Environmental allergies   4. Allergic rhinitis due to animal hair and dander     1. Specialty Care - Encouraged patient to continue to follow up with specialists as scheduled and recommended, including dermatology, urology, and cardiology.  2. Asthma/Allergic Rhinitis - Singulair prescription refilled today. - Encouraged patient to continue taking Singulair and discouraged patient from discontinuing use.  - Encouraged patient to use medication as prescribed and directed. - Discussed that albuterol inhaler is preferably used as a rescue inhaler PRN, not for chronic asthma management.  Patient understands this.  - Return sooner than scheduled if breathing problems exacerbate.  3. Prediabetes - A1c last checked in June 2019 - 5.7. - Re-check with next routine lab work.  4. BMI Counseling Explained to patient what BMI refers to, and what it means medically.    Told patient to think about it as a "medical risk stratification measurement" and how increasing BMI is associated with increasing risk/ or worsening state of various diseases such as hypertension, hyperlipidemia, diabetes, premature OA, depression etc.  American Heart Association guidelines for healthy diet, basically Mediterranean diet, and exercise guidelines of 30 minutes 5 days per week or more discussed in detail.  Health counseling performed.  All questions answered.  5. Lifestyle & Preventative Health Maintenance - Advised patient to continue working toward exercising to improve overall mental, physical, and emotional health.    - Continue to visit the gym at least three times weekly.  Reviewed that patient should check and make sure that he is performing exercises appropriately to avoid injury.  Recommended that patient have someone at  the gym help with prudent form.  - Encouraged patient to engage in daily cardiovascular activity to recommended heart rate goals.  Recommended that the patient eventually strive for at least 150 minutes of moderate cardiovascular activity per week according to guidelines established by the [facility].   - Healthy dietary habits encouraged, including low-carb, and high amounts of lean protein in diet.   - Patient should also consume adequate amounts of water - half of body weight in oz of water per day.  6. Follow-Up - Return for regularly scheduled chronic follow-up.  - Return for labs in 6 months.  - Patient knows to let us know if he has any breathing problem or blood pressure problem prior to February.   No orders of the defined types were placed in this encounter.   Meds ordered this encounter  Medications  . montelukast (SINGULAIR) 10 MG tablet    Sig: Take 1 tablet (10 mg total) by mouth at bedtime.    Dispense:  90 tablet    Refill:  1    Medications Discontinued During This Encounter  Medication Reason  . aspirin EC 81 MG tablet Patient Preference  . fluticasone (FLONASE) 50 MCG/ACT nasal spray Patient Preference  . fluticasone (FLOVENT HFA) 220 MCG/ACT inhaler Patient Preference  . montelukast (SINGULAIR) 10 MG tablet Reorder     Gross side effects, risk and benefits, and alternatives of medications and treatment plan in general discussed with patient.  Patient is aware that all medications have potential side effects and we are unable to predict every side effect or drug-drug interaction that may occur.   Patient will call with any questions prior to using medication if they have concerns.  Expresses  verbal understanding and consents to current therapy and treatment regimen.  No barriers to understanding were identified.  Red flag symptoms and signs discussed in detail.  Patient expressed understanding regarding what to do in case of emergency\urgent symptoms  Please see AVS  handed out to patient at the end of our visit for further patient instructions/ counseling done pertaining to today's office visit.   Return for 76months or sooner if has concerns re: meds/ txmnt plan etc or new symptoms.    Note:  This note was prepared with assistance of Dragon voice recognition software. Occasional wrong-word or sound-a-like substitutions may have occurred due to the inherent limitations of voice recognition software.   This document serves as a record of services personally performed by Mellody Dance, DO. It was created on her behalf by Toni Amend, a trained medical scribe. The creation of this record is based on the scribe's personal observations and the provider's statements to them.   I have reviewed the above medical documentation for accuracy and completeness and I concur.  Mellody Dance 04/12/18 4:57 PM   ---------------------------------------------------------------------------------------------------------------------    Subjective:     HPI: Jeremy Li is a 77 y.o. male who [facility] to [facility] today for issues as discussed below.  Asthma medications changed last OV.  States he's doing great.  Sad news is that his dog died since last appointment.  Goes to [employer] three times weekly to exercise, about an hour each visit.  Does walking and bicycle for 15-20 minutes on each.  His wife comes with him.  They also engage in weight training.  Urology Good news is, he had his exam with the urologist and his PSA has declined.  States that 18 months ago, his PSA was close to 7; it is down now to 5.  No enlargement, nodules, bumps, or anything unusual or alarming.    Follows up with Dr. Junious Li.  Asthma Patient notes he did not like his new inhaler.  Notes that he's usually really tolerant of medicine; however, the new inhaler made him have really irritated "felt like swollen glands" which made him feel wheezy  and thick in his throat (he acts this out in the office).  Notes that he tried it for about 10 days, "I gave it a fair trial."  Did not notice a difference using Neti Pot.  Stopped using Flonase nasal spray.  Patient wishes to discontinue Singulair.  Feels that he is managed on his Claritin and albuterol.  Feels that his asthma is related to stress.    Mows his lawn in the morning and has not noticed dust.  Notes that his symptoms seem to have improved.  They've vacuumed the house over and over since the dog died.  Notes that his asthma is there, but not as often.  Sometimes he wakes up feeling short of breath, "not really gasping, but feeling a little short."  Patient got a spirometer.  Notes that usually he's considerably above the yellow measurements, and has tried to keep track of it.  A low for him is still in the yellow, never in the red.  Starting with Dr. Martinique as a cardiac patient.    Wt Readings from Last 3 Encounters:  02/24/18 198 lb 11.2 oz (90.1 kg)  01/06/18 202 lb (91.6 kg)  08/29/17 200 lb (90.7 kg)   BP Readings from Last 3 Encounters:  02/24/18 133/72  01/06/18 122/73  08/29/17 140/79   Pulse  Readings from Last 3 Encounters:  02/24/18 62  01/06/18 81  08/29/17 61   BMI Readings from Last 3 Encounters:  02/24/18 26.95 kg/m  01/06/18 27.40 kg/m  08/29/17 27.12 kg/m     Patient Care Team    Relationship Specialty Notifications Start End  Mellody Dance, DO PCP - General Family Medicine  08/05/17   Daryll Brod, MD Consulting Physician Orthopedic Surgery  08/05/17    Comment: L second knuckle clicking/ pain  Martinique, Peter M, MD Consulting Physician Cardiology  08/05/17   Jarome Matin, MD Consulting Physician Dermatology  08/05/17   Festus Aloe, MD Consulting Physician Urology  08/05/17   Dyke Maes, [place] Referring Physician Optometry  08/05/17      Patient Active Problem List   Diagnosis Date Noted  . Reactive airway disease with wheezing-  dogs and cats only 06/21/2010    Priority: High  . Glucose intolerance (impaired glucose tolerance) 08/29/2017  . Environmental allergies 08/29/2017  . Vitamin D deficiency 08/29/2017  . Dupuytren's contracture of both hands- sees ortho 08/05/2017  . History of back surgery- 1980's. 08/05/2017  . Medicare annual wellness visit, subsequent 07/23/2016  . Urinary frequency 05/25/2015  . Hyperlipidemia 09/28/2012  . Allergic rhinitis 06/21/2010  . FASTING HYPERGLYCEMIA 10/03/2008  . CONDUCTIVE HEARING LOSS BILATERAL 03/17/2008  . SYNCOPE 03/17/2008    Past Medical history, Surgical history, Family history, Social history, Allergies and Medications have been entered into the medical record, reviewed and changed as needed.    Current Meds  Medication Sig  . albuterol (VENTOLIN HFA) 108 (90 Base) MCG/ACT inhaler Inhale 1-2 puffs into the lungs every 4 (four) hours as needed.  . Cholecalciferol (VITAMIN D3) 2000 units capsule Take 1 capsule (2,000 Units total) by mouth daily.  Marland Kitchen loratadine (CLARITIN) 10 MG tablet TAKE 1 TABLET BY MOUTH EVERY DAY AS NEEDED FOR ALLERGIES  . Lutein 6 MG CAPS Take by mouth daily.  . montelukast (SINGULAIR) 10 MG tablet Take 1 tablet (10 mg total) by mouth at bedtime.  . [DISCONTINUED] montelukast (SINGULAIR) 10 MG tablet Take 1 tablet (10 mg total) by mouth at bedtime.    Allergies:  Allergies  Allergen Reactions  . Dog Epithelium Allergy Skin Test Shortness Of Breath  . Cat Hair Extract     Other reaction(s): Respiratory Distress  . Flovent Hfa [Fluticasone]     Throat burning and felt swollen  . Other     Other reaction(s): Respiratory Distress     Review of Systems:  A fourteen system review of systems was performed and found to be positive as per HPI.   Objective:   Blood pressure 133/72, pulse 62, height 6' (1.829 m), weight 198 lb 11.2 oz (90.1 kg), SpO2 97 %. Body mass index is 26.95 kg/m. General:  Well Developed, well nourished,  appropriate for stated age.  Neuro:  Alert and oriented,  extra-ocular muscles intact  HEENT:  Normocephalic, atraumatic, neck supple, no carotid bruits appreciated  Skin:  no gross rash, warm, pink. Cardiac:  RRR, S1 S2 Respiratory:  ECTA B/L and A/P, Not using accessory muscles, speaking in full sentences- unlabored. Vascular:  Ext warm, no cyanosis apprec.; cap RF less 2 sec. Psych:  No HI/SI, judgement and insight good, Euthymic mood. Full Affect.

## 2018-02-24 NOTE — Patient Instructions (Signed)
Asthma, Adult °Asthma is a recurring condition in which the airways tighten and narrow. Asthma can make it difficult to breathe. It can cause coughing, wheezing, and shortness of breath. Asthma episodes, also called asthma attacks, range from minor to life-threatening. Asthma cannot be cured, but medicines and lifestyle changes can help control it. °What are the causes? °Asthma is believed to be caused by inherited (genetic) and environmental factors, but its exact cause is unknown. Asthma may be triggered by allergens, lung infections, or irritants in the air. Asthma triggers are different for each person. Common triggers include: °· Animal dander. °· Dust mites. °· Cockroaches. °· Pollen from trees or grass. °· Mold. °· Smoke. °· Air pollutants such as dust, household cleaners, hair sprays, aerosol sprays, paint fumes, strong chemicals, or strong odors. °· Cold air, weather changes, and winds (which increase molds and pollens in the air). °· Strong emotional expressions such as crying or laughing hard. °· Stress. °· Certain medicines (such as aspirin) or types of drugs (such as beta-blockers). °· Sulfites in foods and drinks. Foods and drinks that may contain sulfites include dried fruit, potato chips, and sparkling grape juice. °· Infections or inflammatory conditions such as the flu, a cold, or an inflammation of the nasal membranes (rhinitis). °· Gastroesophageal reflux disease (GERD). °· Exercise or strenuous activity. ° °What are the signs or symptoms? °Symptoms may occur immediately after asthma is triggered or many hours later. Symptoms include: °· Wheezing. °· Excessive nighttime or early morning coughing. °· Frequent or severe coughing with a common cold. °· Chest tightness. °· Shortness of breath. ° °How is this diagnosed? °The diagnosis of asthma is made by a review of your medical history and a physical exam. Tests may also be performed. These may include: °· Lung function studies. These tests show how  much air you breathe in and out. °· Allergy tests. °· Imaging tests such as X-rays. ° °How is this treated? °Asthma cannot be cured, but it can usually be controlled. Treatment involves identifying and avoiding your asthma triggers. It also involves medicines. There are 2 classes of medicine used for asthma treatment: °· Controller medicines. These prevent asthma symptoms from occurring. They are usually taken every day. °· Reliever or rescue medicines. These quickly relieve asthma symptoms. They are used as needed and provide short-term relief. ° °Your health care provider will help you create an asthma action plan. An asthma action plan is a written plan for managing and treating your asthma attacks. It includes a list of your asthma triggers and how they may be avoided. It also includes information on when medicines should be taken and when their dosage should be changed. An action plan may also involve the use of a device called a peak flow meter. A peak flow meter measures how well the lungs are working. It helps you monitor your condition. °Follow these instructions at home: °· Take medicines only as directed by your health care provider. Speak with your health care provider if you have questions about how or when to take the medicines. °· Use a peak flow meter as directed by your health care provider. Record and keep track of readings. °· Understand and use the action plan to help minimize or stop an asthma attack without needing to seek medical care. °· Control your home environment in the following ways to help prevent asthma attacks: °? Do not smoke. Avoid being exposed to secondhand smoke. °? Change your heating and air conditioning filter regularly. °? Limit   your use of fireplaces and wood stoves. °? Get rid of pests (such as roaches and mice) and their droppings. °? Throw away plants if you see mold on them. °? Clean your floors and dust regularly. Use unscented cleaning products. °? Try to have someone  else vacuum for you regularly. Stay out of rooms while they are being vacuumed and for a short while afterward. If you vacuum, use a dust mask from a hardware store, a double-layered or microfilter vacuum cleaner bag, or a vacuum cleaner with a HEPA filter. °? Replace carpet with wood, tile, or vinyl flooring. Carpet can trap dander and dust. °? Use allergy-proof pillows, mattress covers, and box spring covers. °? Wash bed sheets and blankets every week in hot water and dry them in a dryer. °? Use blankets that are made of polyester or cotton. °? Clean bathrooms and kitchens with bleach. If possible, have someone repaint the walls in these rooms with mold-resistant paint. Keep out of the rooms that are being cleaned and painted. °? Wash hands frequently. °Contact a health care provider if: °· You have wheezing, shortness of breath, or a cough even if taking medicine to prevent attacks. °· The colored mucus you cough up (sputum) is thicker than usual. °· Your sputum changes from clear or white to yellow, green, gray, or bloody. °· You have any problems that may be related to the medicines you are taking (such as a rash, itching, swelling, or trouble breathing). °· You are using a reliever medicine more than 2-3 times per week. °· Your peak flow is still at 50-79% of your personal best after following your action plan for 1 hour. °· You have a fever. °Get help right away if: °· You seem to be getting worse and are unresponsive to treatment during an asthma attack. °· You are short of breath even at rest. °· You get short of breath when doing very little physical activity. °· You have difficulty eating, drinking, or talking due to asthma symptoms. °· You develop chest pain. °· You develop a fast heartbeat. °· You have a bluish color to your lips or fingernails. °· You are light-headed, dizzy, or faint. °· Your peak flow is less than 50% of your personal best. °This information is not intended to replace advice given to  you by your health care provider. Make sure you discuss any questions you have with your health care provider. °Document Released: 07/08/2005 Document Revised: 12/20/2015 Document Reviewed: 02/04/2013 °Elsevier Interactive Patient Education © 2017 [employer] ° ° °Asthma Attack Prevention, Adult °Although you may not be able to control the fact that you have asthma, you can take actions to prevent episodes of asthma (asthma attacks). These actions include: °· Creating a written plan for managing and treating your asthma attacks (asthma action plan). °· Monitoring your asthma. °· Avoiding things that can irritate your airways or make your asthma symptoms worse (asthma triggers). °· Taking your medicines as directed. °· Acting quickly if you have signs or symptoms of an asthma attack. ° °What are some ways to prevent an asthma attack? °Create a plan °Work with your health care provider to create an asthma action plan. This plan should include: °· A list of your asthma triggers and how to avoid them. °· A list of symptoms that you experience during an asthma attack. °· Information about when to take medicine and how much medicine to take. °· Information to help you understand your peak flow measurements. °· Contact information   for your health care providers. °· Daily actions that you can take to control asthma. ° °Monitor your asthma ° °To monitor your asthma: °· Use your peak flow meter every morning and every evening for 2-3 weeks. Record the results in a journal. A drop in your peak flow numbers on one or more days may mean that you are starting to have an asthma attack, even if you are not having symptoms. °· When you have asthma symptoms, write them down in a journal. ° °Avoid asthma triggers ° °Work with your health care provider to find out what your asthma triggers are. This can be done by: °· Being tested for allergies. °· Keeping a journal that notes when asthma attacks occur and what may have contributed  to them. °· Asking your health care provider whether other medical conditions make your asthma worse. ° °Common asthma triggers include: °· Dust. °· Smoke. This includes campfire smoke and secondhand smoke from tobacco products. °· Pet dander. °· Trees, grasses or pollens. °· Very cold, dry, or humid air. °· Mold. °· Foods that contain high amounts of sulfites. °· Strong smells. °· Engine exhaust and air pollution. °· Aerosol sprays and fumes from household cleaners. °· Household pests and their droppings, including dust mites and cockroaches. °· Certain medicines, including NSAIDs. ° °Once you have determined your asthma triggers, take steps to avoid them. Depending on your triggers, you may be able to reduce the chance of an asthma attack by: °· Keeping your home clean. Have someone dust and vacuum your home for you 1 or 2 times a week. If possible, have them use a high-efficiency particulate arrestance (HEPA) vacuum. °· Washing your sheets weekly in hot water. °· Using allergy-proof mattress covers and casings on your bed. °· Keeping pets out of your home. °· Taking care of mold and water problems in your home. °· Avoiding areas where people smoke. °· Avoiding using strong perfumes or odor sprays. °· Avoid spending a lot of time outdoors when pollen counts are high and on very windy days. °· Talking with your health care provider before stopping or starting any new medicines. ° °Medicines °Take over-the-counter and prescription medicines only as told by your health care provider. Many asthma attacks can be prevented by carefully following your medicine schedule. Taking your medicines correctly is especially important when you cannot avoid certain asthma triggers. Even if you are doing well, do not stop taking your medicine and do not take less medicine. °Act quickly °If an asthma attack happens, acting quickly can decrease how severe it is and how long it lasts. Take these actions: °· Pay attention to your  symptoms. If you are coughing, wheezing, or having difficulty breathing, do not wait to see if your symptoms go away on their own. Follow your asthma action plan. °· If you have followed your asthma action plan and your symptoms are not improving, call your health care provider or seek immediate medical care at the nearest hospital. ° °It is important to write down how often you need to use your fast-acting rescue inhaler. You can track how often you use an inhaler in your journal. If you are using your rescue inhaler more often, it may mean that your asthma is not under control. Adjusting your asthma treatment plan may help you to prevent future asthma attacks and help you to gain better control of your condition. °How can I prevent an asthma attack when I exercise? ° °Exercise is a common asthma trigger.   To prevent asthma attacks during exercise: °· Follow advice from your health care provider about whether you should use your fast-acting inhaler before exercising. Many people with asthma experience exercise-induced bronchoconstriction (EIB). This condition often worsens during vigorous exercise in cold, humid, or dry environments. Usually, people with EIB can stay very active by using a fast-acting inhaler before exercising. °· Avoid exercising outdoors in very cold or humid weather. °· Avoid exercising outdoors when pollen counts are high. °· Warm up and cool down when exercising. °· Stop exercising right away if asthma symptoms start. ° °Consider taking part in exercises that are less likely to cause asthma symptoms such as: °· Indoor swimming. °· Biking. °· Walking. °· Hiking. °· Playing football. ° °This information is not intended to replace advice given to you by your health care provider. Make sure you discuss any questions you have with your health care provider. °Document Released: 06/26/2009 Document Revised: 03/08/2016 Document Reviewed: 12/23/2015 °Elsevier Interactive Patient Education © 2018  [employer] ° °

## 2018-03-30 ENCOUNTER — Encounter: Payer: Self-pay | Admitting: Cardiology
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
## 2018-04-13 ENCOUNTER — Ambulatory Visit: Payer: PPO | Admitting: Cardiology

## 2018-04-13 ENCOUNTER — Encounter: Payer: Self-pay | Admitting: Cardiology

## 2018-04-13 VITALS — BP 123/75 | HR 68 | Ht 73.0 in | Wt 199.4 lb

## 2018-04-13 DIAGNOSIS — I493 Ventricular premature depolarization: Secondary | ICD-10-CM

## 2018-04-21 DIAGNOSIS — L720 Epidermal cyst: Secondary | ICD-10-CM | POA: Diagnosis not present

## 2018-04-21 DIAGNOSIS — L570 Actinic keratosis: Secondary | ICD-10-CM | POA: Diagnosis not present

## 2018-07-07 ENCOUNTER — Encounter: Payer: Self-pay | Admitting: Family Medicine

## 2018-07-07 ENCOUNTER — Ambulatory Visit (INDEPENDENT_AMBULATORY_CARE_PROVIDER_SITE_OTHER): Payer: PPO | Admitting: Family Medicine

## 2018-07-07 VITALS — BP 136/80 | HR 66 | Temp 98.3°F | Ht 72.0 in | Wt 200.9 lb

## 2018-07-07 DIAGNOSIS — Z0000 Encounter for general adult medical examination without abnormal findings: Secondary | ICD-10-CM

## 2018-07-07 NOTE — Patient Instructions (Signed)

## 2018-07-07 NOTE — Progress Notes (Signed)
Subjective:   Jeremy Li is a 77 y.o. male who presents for Medicare Annual/Subsequent preventive examination.  HPI: - denied abnormal memory changes, balance issues, dizziness - exercising 3x a week: biceps, triceps, leg lifts, treadmill, bicycle - notes pulse of 92 on treadmill and bicycle - improved hand strength with dupuytren injections - wife notes they haven't walked as much since their dog passed away, but they attend to and like making Goals and enjoy it. - he underwent precancerous skin removal by Derm recently - vision of 20/25, uses +2 reading glasses - he does mentally active things every day. He takes care of old films (on reels), reads, does jumble world puzzles - they continue to eat well  -sees Dr. Junious Silk in urology- yrly male exams are done -sees Dr. Peter Martinique in cardiology        Objective:    Vitals: BP 136/80   Pulse 66   Temp 98.3 F (36.8 C)   Ht 6' (1.829 m)   Wt 200 lb 14.4 oz (91.1 kg)   SpO2 98%   BMI 27.25 kg/m   Body mass index is 27.25 kg/m.  Advanced Directives 08/05/2017  Does Patient Have a Medical Advance Directive? Yes  Type of Advance Directive Living will;Healthcare Power of [place] in Chart? No - copy requested    Tobacco Social History   Tobacco Use  Smoking Status Former Smoker  . Packs/day: 4.00  . Years: 0.50  . Pack years: 2.00  . Last attempt to quit: 07/22/1961  . Years since quitting: 57.0  Smokeless Tobacco Never Used  Tobacco Comment   smoked pipe 1-2 bowls/ day ages [age]     Counseling given: Not Answered Comment: smoked pipe 1-2 bowls/ day ages [age]      Past Medical History:  Diagnosis Date  . Allergy    pets & seasonal allergens   Past Surgical History:  Procedure Laterality Date  . CATARACT EXTRACTION, BILATERAL     Dr Katy Fitch  . DUPUYTREN / PALMAR FASCIOTOMY     Dr Daylene Katayama  . HERNIA REPAIR    . LUMBAR DISC SURGERY  1989   Dr Cheri Rous  .  TONSILLECTOMY     Family History  Problem Relation Age of Onset  . Heart disease Sister        cardiomegaly  . COPD Neg Hx   . Asthma Neg Hx   . Cancer Neg Hx   . Diabetes Neg Hx   . Stroke Neg Hx    Social History   Socioeconomic History  . Marital status: Married    Spouse name: Not on file  . Number of children: 3  . Years of education: 53  . Highest education level: Not on file  Occupational History  . Occupation: Retired  Scientific laboratory technician  . Financial resource strain: Not on file  . Food insecurity:    Worry: Not on file    Inability: Not on file  . Transportation needs:    Medical: Not on file    Non-medical: Not on file  Tobacco Use  . Smoking status: Former Smoker    Packs/day: 4.00    Years: 0.50    Pack years: 2.00    Last attempt to quit: 07/22/1961    Years since quitting: 57.0  . Smokeless tobacco: Never Used  . Tobacco comment: smoked pipe 1-2 bowls/ day ages [age]  Substance and Sexual Activity  . Alcohol use: Yes  Alcohol/week: 5.0 standard drinks    Types: 5 Standard drinks or equivalent per week  . Drug use: No  . Sexual activity: Yes    Birth control/protection: None  Lifestyle  . Physical activity:    Days per week: Not on file    Minutes per session: Not on file  . Stress: Not on file  Relationships  . Social connections:    Talks on phone: Not on file    Gets together: Not on file    Attends religious service: Not on file    Active member of club or organization: Not on file    Attends meetings of clubs or organizations: Not on file    Relationship status: Not on file  Other Topics Concern  . Not on file  Social History Narrative   Denies abuse and feels safe at home.     Outpatient Encounter Medications as of 07/07/2018  Medication Sig  . albuterol (VENTOLIN HFA) 108 (90 Base) MCG/ACT inhaler Inhale 1-2 puffs into the lungs every 4 (four) hours as needed.  . Cholecalciferol (VITAMIN D3) 2000 units capsule Take 1 capsule (2,000  Units total) by mouth daily.  Marland Kitchen loratadine (CLARITIN) 10 MG tablet TAKE 1 TABLET BY MOUTH EVERY DAY AS NEEDED FOR ALLERGIES  . Lutein 6 MG CAPS Take by mouth daily.  . montelukast (SINGULAIR) 10 MG tablet Take 1 tablet (10 mg total) by mouth at bedtime.   No facility-administered encounter medications on file as of 07/07/2018.     Activities of Daily Living In your present state of health, do you have any difficulty performing the following activities: 07/07/2018 01/06/2018  Hearing? N N  Vision? N N  Difficulty concentrating or making decisions? N N  Walking or climbing stairs? N N  Dressing or bathing? N N  Doing errands, shopping? N N  Some recent data might be hidden   Activities of Daily Living In your present state of health, do you have difficulty performing the following activities?  1- Driving - no 2- Managing money - no 3- Feeding yourself - no 4- Getting from the bed to the chair - no 5- Climbing a flight of stairs - no 6- Preparing food and eating - no 7- Bathing or showering - no 8- Getting dressed - no 9- Getting to the toilet - no 10- Using the toilet - no 11- Moving around from place to place - no  Patient states that he does feel safe at home.  Patient Care Team: Mellody Dance, DO as PCP - General (Family Medicine) Daryll Brod, MD as Consulting Physician (Orthopedic Surgery) Martinique, Peter M, MD as Consulting Physician (Cardiology) Jarome Matin, MD as Consulting Physician (Dermatology) Festus Aloe, MD as Consulting Physician (Urology) Dyke Maes, OD as Referring Physician (Optometry)   Assessment:   This is a routine wellness examination for Jeremy Li.  Exercise Activities and Dietary recommendations Current Exercise Habits: Home exercise routine;Structured exercise class, Type of exercise: strength training/weights;treadmill;walking, Frequency (Times/Week): 3, Intensity: Moderate  Goals   None     Fall Risk Fall Risk  07/07/2018 01/06/2018  08/05/2017 07/23/2016 09/28/2012  Falls in the past year? 0 No No No No   Is the patient's home free of loose throw rugs in walkways, pet beds, electrical cords, etc?   yes      Grab bars in the bathroom? yes      Handrails on the stairs?   no stairs in the home      Adequate lighting?  yes   Timed Get Up and Go Performed: passed  Depression Screen PHQ 2/9 Scores 07/07/2018 02/24/2018 01/06/2018 08/29/2017  PHQ - 2 Score 0 0 0 0  PHQ- 9 Score 0 0 0 0   Depression screen [facility] 2/9 07/07/2018 02/24/2018 01/06/2018 08/29/2017 08/05/2017  Decreased Interest 0 0 0 0 0  Down, Depressed, Hopeless 0 0 0 0 0  PHQ - 2 Score 0 0 0 0 0  Altered sleeping 0 0 0 0 0  Tired, decreased energy 0 0 0 0 0  Change in appetite 0 0 0 0 0  Feeling bad or failure about yourself  0 0 0 0 0  Trouble concentrating 0 0 0 0 0  Moving slowly or fidgety/restless 0 0 0 0 0  Suicidal thoughts 0 0 0 0 0  PHQ-9 Score 0 0 0 0 0  Difficult doing work/chores Not difficult at all Not difficult at all Not difficult at all Not difficult at all Not difficult at all   Cognitive Function     6CIT Screen 07/07/2018  What Year? 0 points  What month? 0 points  What time? 0 points  Count back from 20 0 points  Months in reverse 0 points  Repeat phrase 0 points  Total Score 0    Immunization History  Administered Date(s) Administered  . Influenza Whole 05/25/2008, 05/22/2009, 04/21/2012  . Influenza, High Dose Seasonal PF 04/21/2014, 05/29/2015, 04/23/2017  . Influenza, Seasonal, Injecte, Preservative Fre 05/07/2013  . Influenza,inj,Quad PF,6+ Mos 04/23/2017  . Influenza-Unspecified 05/29/2015, 04/23/2017  . Pneumococcal Conjugate-13 04/21/2014  . Pneumococcal Polysaccharide-23 05/29/2015  . Td 09/27/2008  . Zoster 03/29/2014  . Zoster Recombinat (Shingrix) 06/28/2017, 10/07/2017    Qualifies for Shingles Vaccine? Patient had vaccines on 06/28/2017 and 10/07/2017.  Screening Tests Health Maintenance  Topic Date Due  .  INFLUENZA VACCINE  02/19/2018  . TETANUS/TDAP  09/28/2018  . PNA vac Low Risk Adult  Completed       Plan:    Assessment/Plan: - recommend cologuard every 3 years- as new medicare rules may cover up til age 77; pt will call and confirm with his insurance.  He "likes to stay on top of things" and get these screening tests - recommend 30 minutes of cardio 5 days a week to improve mentality and physicality  I have personally reviewed and noted the following in the patient's chart:   . Medical and social history . Use of alcohol, tobacco or illicit drugs  . Current medications and supplements . Functional ability and status . Nutritional status . Physical activity . Advanced directives . List of other physicians . Hospitalizations, surgeries, and ER visits in previous 12 months . Vitals . Screenings to include cognitive, depression, and falls . Referrals and appointments  In addition, I have reviewed and discussed with patient certain preventive protocols, quality metrics, and best practice recommendations. A written personalized care plan for preventive services as well as general preventive health recommendations were provided to patient.     Mellody Dance, DO  07/08/2018

## 2018-08-29 ENCOUNTER — Other Ambulatory Visit: Payer: Self-pay | Admitting: Family Medicine

## 2018-08-29 DIAGNOSIS — Z9109 Other allergy status, other than to drugs and biological substances: Secondary | ICD-10-CM

## 2018-09-07 ENCOUNTER — Ambulatory Visit: Payer: PPO | Admitting: Family Medicine

## 2018-10-12 ENCOUNTER — Encounter: Payer: Self-pay | Admitting: Family Medicine

## 2018-12-01 ENCOUNTER — Other Ambulatory Visit: Payer: Self-pay | Admitting: Family Medicine

## 2018-12-01 DIAGNOSIS — Z9109 Other allergy status, other than to drugs and biological substances: Secondary | ICD-10-CM

## 2018-12-01 DIAGNOSIS — J4540 Moderate persistent asthma, uncomplicated: Secondary | ICD-10-CM

## 2019-01-06 ENCOUNTER — Ambulatory Visit (INDEPENDENT_AMBULATORY_CARE_PROVIDER_SITE_OTHER): Payer: PPO | Admitting: Family Medicine

## 2019-01-06 ENCOUNTER — Other Ambulatory Visit: Payer: Self-pay

## 2019-01-06 ENCOUNTER — Encounter: Payer: Self-pay | Admitting: Family Medicine

## 2019-01-06 VITALS — HR 62 | Temp 98.7°F | Ht 72.0 in | Wt 197.0 lb

## 2019-01-06 DIAGNOSIS — R7302 Impaired glucose tolerance (oral): Secondary | ICD-10-CM

## 2019-01-06 DIAGNOSIS — E782 Mixed hyperlipidemia: Secondary | ICD-10-CM | POA: Diagnosis not present

## 2019-01-06 DIAGNOSIS — T7840XA Allergy, unspecified, initial encounter: Secondary | ICD-10-CM | POA: Insufficient documentation

## 2019-01-06 DIAGNOSIS — M720 Palmar fascial fibromatosis [Dupuytren]: Secondary | ICD-10-CM | POA: Diagnosis not present

## 2019-01-06 DIAGNOSIS — J4540 Moderate persistent asthma, uncomplicated: Secondary | ICD-10-CM | POA: Diagnosis not present

## 2019-01-06 DIAGNOSIS — E559 Vitamin D deficiency, unspecified: Secondary | ICD-10-CM

## 2019-01-06 NOTE — Progress Notes (Signed)
Telehealth office visit note for Jeremy Li, D.O- at Primary Care at [facility]   I connected with current patient today and verified that I am speaking with the correct person using two identifiers.   . Location of the patient: Home . Location of the provider: Office Only the patient (+/- their family members at pt's discretion) and myself were participating in the encounter    - This visit type was conducted due to national recommendations for restrictions regarding the COVID-19 Pandemic (e.g. social distancing) in an effort to limit this patient's exposure and mitigate transmission in our community.  This format is felt to be most appropriate for this patient at this time.   - The patient did not have access to video technology or had technical difficulties with video requiring transitioning to audio format only. - No physical exam could be performed with this format, beyond that communicated to [place] by the patient/ family members as noted.   - Additionally my office staff/ schedulers discussed with the patient that there may be a monetary charge related to this service, depending on their medical insurance.   The patient expressed understanding, and agreed to proceed.       History of Present Illness:  RAD - Using Proair 3-4 times/day!    Did get a peak flow meter.   Tried singulair- but thinks he doesn't need it.      Bump on his R hand, Index, middle knuckle.  No pain/ soreness, no redness.  - H/o Dupuytren contractures, looks like it did when he had his Dupuytren contractures flareup in the past  Aug 3rd- Dr Silvio Pate just wanted me to know he is following up with him   Impression and Recommendations:    1. Moderate persistent reactive airway disease with wheezing without complication   2. Vitamin D deficiency   3. Mixed hyperlipidemia   4. Glucose intolerance (impaired glucose tolerance)   5. Dupuytren's contracture of hand     -Explained to patient he needs to  take his Singulair and avoid taking albuterol so much.  Discussed risk benefits of this medicine.  If breathing cannot get under better control with the Singulair and less than 2 times per week of albuterol, he will need pulmonary function tests and further evaluation. -We will assess whether or not his vitamin D is in the normal limits.  He is taking vitamin D supplements - will reck fasting lipid profile near future.  We discussed prudent diet and exercise as well. -Prediabetes: History of it being slightly elevated-will need recheck in near future. -For contractures-as long as he still has function and can perform ADLs etc., no need for treatment however, if patient feels it is interfering with his functionality, we will send him to sports med or orthopedics for treatment.  He is already gone to 1 and is established.  He may just follow-up on his own. - As part of my medical decision making, I reviewed the following data within the [place] History obtained from pt /family, CMA notes reviewed and incorporated if applicable, Labs reviewed, Radiograph/ tests reviewed if applicable and OV notes from prior OV's with me, as well as other specialists she/he has seen since seeing me last, were all reviewed and used in my medical decision making process today.   - Additionally, discussion had with patient regarding txmnt plan, and their biases/concerns about that plan were used in my medical decision making today.   -  The patient agreed with the plan and demonstrated an understanding of the instructions.   No barriers to understanding were identified.   - Red flag symptoms and signs discussed in detail.  Patient expressed understanding regarding what to do in case of emergency\ urgent symptoms.  The patient was advised to call back or seek an in-person evaluation if the symptoms worsen or if the condition fails to improve as anticipated.  Return for F BW near future, follow-up 3 months 4 RAD  restart singulair- using albuterol too much.    Medications Discontinued During This Encounter  Medication Reason  . montelukast (SINGULAIR) 10 MG tablet Patient has not taken in last 30 days     I provided 20+ minutes of non-face-to-face time during this encounter,with over 50% of the time in direct counseling on patients medical conditions/ medical concerns.  Additional time was spent with charting and coordination of care after the actual visit commenced.   Note:  This note was prepared with assistance of Dragon voice recognition software. Occasional wrong-word or sound-a-like substitutions may have occurred due to the inherent limitations of voice recognition software.  Jeremy Dance, DO Jeremy Dance, DO 01/27/2019 8:19 AM     Patient Care Team    Relationship Specialty Notifications Start End  Jeremy Dance, DO PCP - General Family Medicine  08/05/17   Daryll Brod, [place] Physician Orthopedic Surgery  08/05/17    Comment: L second knuckle clicking/ pain  Martinique, Peter M, MD Consulting Physician Cardiology  08/05/17   Jarome Matin, MD Consulting Physician Dermatology  08/05/17   Festus Aloe, MD Consulting Physician Urology  08/05/17   Dyke Maes, [place] Referring Physician Optometry  08/05/17      -Vitals obtained; medications/ allergies reconciled;  personal medical, social, Sx etc.histories were updated by CMA, reviewed by me and are reflected in chart   Patient Active Problem List   Diagnosis Date Noted  . Glucose intolerance (impaired glucose tolerance) 08/29/2017    Priority: High  . Hyperlipidemia 09/28/2012    Priority: High  . Reactive airway disease with wheezing- dogs and cats only 06/21/2010    Priority: High  . Vitamin D deficiency 08/29/2017    Priority: Low  . Dupuytren's contracture of hand 01/27/2019  . Allergy   . Environmental allergies 08/29/2017  . Dupuytren's contracture of both hands- sees ortho 08/05/2017  . History of back  surgery- 1980's. 08/05/2017  . Medicare annual wellness visit, subsequent 07/23/2016  . Urinary frequency 05/25/2015  . Allergic rhinitis 06/21/2010  . FASTING HYPERGLYCEMIA 10/03/2008  . CONDUCTIVE HEARING LOSS BILATERAL 03/17/2008  . SYNCOPE 03/17/2008     Current Meds  Medication Sig  . Cholecalciferol (VITAMIN D3) 2000 units capsule Take 1 capsule (2,000 Units total) by mouth daily.  Marland Kitchen loratadine (CLARITIN) 10 MG tablet TAKE 1 TABLET BY MOUTH EVERY DAY AS NEEDED FOR ALLERGIES  . Lutein 6 MG CAPS Take by mouth daily.  Marland Kitchen PROAIR HFA 108 (90 Base) MCG/ACT inhaler INHALE 1 TO 2 PUFFS INTO THE LUNGS EVERY 4 HOURS AS NEEDED.     Allergies:  Allergies  Allergen Reactions  . Dog Epithelium Allergy Skin Test Shortness Of Breath    Other reaction(s): Respiratory distress  . Cat Hair Extract     Other reaction(s): Respiratory Distress  . Flovent Hfa [Fluticasone]     Throat burning and felt swollen  . Other     Other reaction(s): Respiratory Distress Other reaction(s): Respiratory distress     ROS:  See above  HPI for pertinent positives and negatives   Objective:   Pulse 62, temperature 98.7 F (37.1 C), height 6' (1.829 m), weight 197 lb (89.4 kg).  (if some vitals are omitted, this means that patient was UNABLE to obtain them even though they were asked to get them prior to OV today.  They were asked to call us at their earliest convenience with these once obtained. )  General: A & O * 3; sounds in no acute distress; in usual state of health.  Skin: Pt confirms warm and dry extremities and pink fingertips HEENT: Pt confirms lips non-cyanotic Chest: Patient confirms normal chest excursion and movement Respiratory: speaking in full sentences, no conversational dyspnea; patient confirms no use of accessory muscles Psych: insight appears good, mood- appears full

## 2019-01-14 ENCOUNTER — Encounter: Payer: Self-pay | Admitting: Family Medicine

## 2019-01-21 ENCOUNTER — Other Ambulatory Visit (INDEPENDENT_AMBULATORY_CARE_PROVIDER_SITE_OTHER): Payer: PPO

## 2019-01-21 ENCOUNTER — Other Ambulatory Visit: Payer: Self-pay

## 2019-01-21 DIAGNOSIS — R7309 Other abnormal glucose: Secondary | ICD-10-CM

## 2019-01-21 DIAGNOSIS — R7302 Impaired glucose tolerance (oral): Secondary | ICD-10-CM

## 2019-01-21 DIAGNOSIS — Z0000 Encounter for general adult medical examination without abnormal findings: Secondary | ICD-10-CM | POA: Diagnosis not present

## 2019-01-21 DIAGNOSIS — E785 Hyperlipidemia, unspecified: Secondary | ICD-10-CM

## 2019-01-21 DIAGNOSIS — E559 Vitamin D deficiency, unspecified: Secondary | ICD-10-CM

## 2019-01-22 LAB — COMPREHENSIVE METABOLIC PANEL
ALT: 16 IU/L (ref 0–44)
AST: 18 IU/L (ref 0–40)
Albumin/Globulin Ratio: 2.1 (ref 1.2–2.2)
Albumin: 4.6 g/dL (ref 3.7–4.7)
Alkaline Phosphatase: 67 IU/L (ref 39–117)
BUN/Creatinine Ratio: 14 (ref 10–24)
BUN: 12 mg/dL (ref 8–27)
Bilirubin Total: 0.6 mg/dL (ref 0.0–1.2)
CO2: 20 mmol/L (ref 20–29)
Calcium: 9.1 mg/dL (ref 8.6–10.2)
Chloride: 106 mmol/L (ref 96–106)
Creatinine, Ser: 0.85 mg/dL (ref 0.76–1.27)
GFR calc Af Amer: 96 mL/min/{1.73_m2} (ref >59)
GFR calc non Af Amer: 83 mL/min/{1.73_m2} (ref >59)
Globulin, Total: 2.2 g/dL (ref 1.5–4.5)
Glucose: 99 mg/dL (ref 65–99)
Potassium: 5 mmol/L (ref 3.5–5.2)
Sodium: 141 mmol/L (ref 134–144)
Total Protein: 6.8 g/dL (ref 6.0–8.5)

## 2019-01-22 LAB — LIPID PANEL
Chol/HDL Ratio: 2.2 ratio (ref 0.0–5.0)
Cholesterol, Total: 213 mg/dL — ABNORMAL HIGH (ref 100–199)
HDL: 99 mg/dL (ref >39)
LDL Calculated: 100 mg/dL — ABNORMAL HIGH (ref 0–99)
Triglycerides: 69 mg/dL (ref 0–149)
VLDL Cholesterol Cal: 14 mg/dL (ref 5–40)

## 2019-01-22 LAB — CBC WITH DIFFERENTIAL/PLATELET
Basophils Absolute: 0.1 10*3/uL (ref 0.0–0.2)
Basos: 1 %
EOS (ABSOLUTE): 0.2 10*3/uL (ref 0.0–0.4)
Eos: 4 %
Hematocrit: 44.6 % (ref 37.5–51.0)
Hemoglobin: 15.7 g/dL (ref 13.0–17.7)
Immature Grans (Abs): 0 10*3/uL (ref 0.0–0.1)
Immature Granulocytes: 0 %
Lymphocytes Absolute: 1 10*3/uL (ref 0.7–3.1)
Lymphs: 18 %
MCH: 32.6 pg (ref 26.6–33.0)
MCHC: 35.2 g/dL (ref 31.5–35.7)
MCV: 93 fL (ref 79–97)
Monocytes Absolute: 0.7 10*3/uL (ref 0.1–0.9)
Monocytes: 13 %
Neutrophils Absolute: 3.7 10*3/uL (ref 1.4–7.0)
Neutrophils: 64 %
Platelets: 325 10*3/uL (ref 150–450)
RBC: 4.82 x10E6/uL (ref 4.14–5.80)
RDW: 12.3 % (ref 11.6–15.4)
WBC: 5.8 10*3/uL (ref 3.4–10.8)

## 2019-01-22 LAB — HEMOGLOBIN A1C
Est. average glucose Bld gHb Est-mCnc: 120 mg/dL
Hgb A1c MFr Bld: 5.8 % — ABNORMAL HIGH (ref 4.8–5.6)

## 2019-01-22 LAB — T3: T3, Total: 80 ng/dL (ref 71–180)

## 2019-01-22 LAB — VITAMIN D 25 HYDROXY (VIT D DEFICIENCY, FRACTURES): Vit D, 25-Hydroxy: 29.5 ng/mL — ABNORMAL LOW (ref 30.0–100.0)

## 2019-01-22 LAB — T4, FREE: Free T4: 1.11 ng/dL (ref 0.82–1.77)

## 2019-01-22 LAB — TSH: TSH: 2.07 u[IU]/mL (ref 0.450–4.500)

## 2019-01-27 DIAGNOSIS — M720 Palmar fascial fibromatosis [Dupuytren]: Secondary | ICD-10-CM | POA: Insufficient documentation

## 2019-02-02 DIAGNOSIS — D3617 Benign neoplasm of peripheral nerves and autonomic nervous system of trunk, unspecified: Secondary | ICD-10-CM | POA: Diagnosis not present

## 2019-02-02 DIAGNOSIS — L723 Sebaceous cyst: Secondary | ICD-10-CM | POA: Diagnosis not present

## 2019-02-02 DIAGNOSIS — L821 Other seborrheic keratosis: Secondary | ICD-10-CM | POA: Diagnosis not present

## 2019-02-02 DIAGNOSIS — D1801 Hemangioma of skin and subcutaneous tissue: Secondary | ICD-10-CM | POA: Diagnosis not present

## 2019-02-09 ENCOUNTER — Encounter: Payer: Self-pay | Admitting: Family Medicine

## 2019-02-15 DIAGNOSIS — N400 Enlarged prostate without lower urinary tract symptoms: Secondary | ICD-10-CM | POA: Diagnosis not present

## 2019-02-22 DIAGNOSIS — N400 Enlarged prostate without lower urinary tract symptoms: Secondary | ICD-10-CM | POA: Diagnosis not present

## 2019-02-22 DIAGNOSIS — R972 Elevated prostate specific antigen [PSA]: Secondary | ICD-10-CM | POA: Diagnosis not present

## 2019-03-23 ENCOUNTER — Encounter: Payer: Self-pay | Admitting: Family Medicine

## 2019-04-08 ENCOUNTER — Encounter: Payer: Self-pay | Admitting: Family Medicine

## 2019-04-08 ENCOUNTER — Ambulatory Visit (INDEPENDENT_AMBULATORY_CARE_PROVIDER_SITE_OTHER): Payer: PPO | Admitting: Family Medicine

## 2019-04-08 ENCOUNTER — Other Ambulatory Visit: Payer: Self-pay

## 2019-04-08 VITALS — BP 122/69 | HR 68 | Temp 96.5°F | Ht 72.0 in | Wt 197.0 lb

## 2019-04-08 DIAGNOSIS — Z5320 Procedure and treatment not carried out because of patient's decision for unspecified reasons: Secondary | ICD-10-CM | POA: Diagnosis not present

## 2019-04-08 DIAGNOSIS — R7302 Impaired glucose tolerance (oral): Secondary | ICD-10-CM

## 2019-04-08 DIAGNOSIS — C439 Malignant melanoma of skin, unspecified: Secondary | ICD-10-CM | POA: Diagnosis not present

## 2019-04-08 DIAGNOSIS — Z9109 Other allergy status, other than to drugs and biological substances: Secondary | ICD-10-CM

## 2019-04-08 DIAGNOSIS — E559 Vitamin D deficiency, unspecified: Secondary | ICD-10-CM | POA: Diagnosis not present

## 2019-04-08 DIAGNOSIS — E785 Hyperlipidemia, unspecified: Secondary | ICD-10-CM

## 2019-04-08 DIAGNOSIS — J4540 Moderate persistent asthma, uncomplicated: Secondary | ICD-10-CM | POA: Diagnosis not present

## 2019-04-08 MED ORDER — BUDESONIDE-FORMOTEROL FUMARATE 80-4.5 MCG/ACT IN AERO: 2.0000 | INHALATION_SPRAY | Freq: Two times a day (BID) | RESPIRATORY_TRACT | 1 refills | Status: DC

## 2019-04-08 NOTE — Progress Notes (Signed)
Telehealth office visit note for Mellody Dance, D.O- at Primary Care at [facility]   I connected with current patient today and verified that I am speaking with the correct person using two identifiers.   . Location of the patient: Home . Location of the provider: Office Only the patient (+/- their family members at pt's discretion) and myself were participating in the encounter    - This visit type was conducted due to national recommendations for restrictions regarding the COVID-19 Pandemic (e.g. social distancing) in an effort to limit this patient's exposure and mitigate transmission in our community.  This format is felt to be most appropriate for this patient at this time.   - The patient did not have access to video technology or had technical difficulties with video requiring transitioning to audio format only. - No physical exam could be performed with this format, beyond that communicated to [place] by the patient/ family members as noted.   - Additionally my office staff/ schedulers discussed with the patient that there may be a monetary charge related to this service, depending on their medical insurance.   The patient expressed understanding, and agreed to proceed.       History of Present Illness:   Says he's doing well.  "I'm great, I'm better than I deserve, probably."  Breathing & Allergies - Persistent RAD with Wheeze Goal was to take albuterol only twice per week or less.  Patient states he's probably taking it a little more than twice per week.  Confirms taking Singulair nightly.  "I would say that I've gotten back to where I was when I first had the routine of taking loratadine once daily in the morning, singular once a day at night, and then I also have the nasal fluticasone, doing Flonase every once in a while, not every day, but there are a lot of times."  Says this late summer, his nose was really "snuffy" due to "everything blooming."  Patient continuously  states that he uses his albuterol sometimes up to 2-3 times per day, and "when I need it I need it."  States that he has no adverse side-effects to his albuterol inhaler.  Vitamin D Deficiency Notes now taking 2000 units of Vitamin D daily.  Cholesterol Management & Diet Says they are eating fish twice weekly, "almost off of red meat."  Says "we're not vegetarian, but instead of having beef burgers, we are having the [place] patties.  Says trying to eat fruits and vegetables and fresh foods.  Notes not losing weight.  Dermatological Health Notes had a melanoma taken off of his nose through Mohs surgery.    GAD 7 : Generalized Anxiety Score 01/06/2019  Nervous, Anxious, on Edge 0  Control/stop worrying 0  Worry too much - different things 0  Trouble relaxing 0  Restless 0  Easily annoyed or irritable 0  Afraid - awful might happen 0  Total GAD 7 Score 0  Anxiety Difficulty Not difficult at all    Depression screen [place] 2/9 01/06/2019 07/07/2018 02/24/2018 01/06/2018 08/29/2017  Decreased Interest 0 0 0 0 0  Down, Depressed, Hopeless 0 0 0 0 0  PHQ - 2 Score 0 0 0 0 0  Altered sleeping 0 0 0 0 0  Tired, decreased energy 0 0 0 0 0  Change in appetite 0 0 0 0 0  Feeling bad or failure about yourself  0 0 0 0 0  Trouble concentrating  0 0 0 0 0  Moving slowly or fidgety/restless 0 0 0 0 0  Suicidal thoughts 0 0 0 0 0  PHQ-9 Score 0 0 0 0 0  Difficult doing work/chores Not difficult at all Not difficult at all Not difficult at all Not difficult at all Not difficult at all        Impression and Recommendations:    1. Moderate persistent reactive airway disease with wheezing without complication   2. Environmental allergies   3. Glucose intolerance (impaired glucose tolerance)   4. Hyperlipidemia, unspecified hyperlipidemia type   5. Statin declined   6. Vitamin D deficiency   7. Melanoma of skin ([place])- nose       Moderate Persistent RAD with Wheeze,  environmental Allergies - Advised the patient to begin using AYR or Neilmed sinus rinses BID followed by flonase BID (one spray to each nostril). Advised that the patient may also incorporate allegra or claritin PRN.   - Recommended using Flonase daily during bad allergy season.  - Given patient's persistent asthma, STRONGLY advised patient to begin long-acting Symbicort today.  Extensively discussed need for long-acting medication and pulmonary function tests.  - Patient agrees to begin Symbicort today.  See med list. - Continue Singulair as prescribed.  See med list.  - Patient will let us know in a couple of weeks if he has not reduced his albuterol use to recommended levels.  If he continues using rescue albuterol, will increase dose of Symbicort.  - Explained to patient he needs to take his meds as prescribed.  If breathing cannot get under better control with the Singulair + Symbicort, and less than 2 times per week of albuterol, he will need pulmonary function tests and further evaluation.  - Risks and benefits of albuterol use discussed extensively with patient. - Reviewed that albuterol rescue inhaler should be used twice weekly or less. - Strongly advised patient to REDUCE use of rescue inhaler.  - Will continue to monitor closely.  Vitamin D Deficiency - Vitamin D 29.5 last check. - Discussed that pt Vitamin D has never been in optimal levels.  - Pt last told to double from 2000 IU's daily to 4000 IU's daily, with re-check in 4 months. - Per patient, states he had been mistakenly taking 1000 IU's Vitamin D daily prior to last lab work. - Advised patient to take 4000-5000 IU's daily if he desires to continue OTC supplementation.  - Discussed starting prescription Vitamin D once weekly. - Patient declines once-weekly Vitamin D prescription.  - Will re-check in 4 months.  Prediabetes - A1c stable at 5.8 two months ago on July 2. - Will continue to monitor and re-check as  recommended.  Hyperlipidemia - Statin Declined - LDL = 100, elevated - HDL = 99  The 10-year ASCVD risk score Mikey Bussing DC Jr., et al., 2013) is: 25.2%   Values used to calculate the score:     Age: 78 years     Sex: Male     Is Non-Hispanic African American: No     Diabetic: No     Tobacco smoker: No     Systolic Blood Pressure: 123XX123 mmHg     Is BP treated: No     HDL Cholesterol: 99 mg/dL     Total Cholesterol: 213 mg/dL  - Reviewed risk with patient.  - Extensively discussed that patient meets criteria to begin cholesterol medication at this time. - Patient states "I am not right now interested at all  in any kind of statin." - Statin declined.  - Patient will follow advice of cardiology regarding his cholesterol.  He understands what the 10-year risk indicates and will follow up with cardiology.  Declines meds at this time.  - Ongoing dietary changes such as low saturated & trans fat and low carb diets discussed with patient.  Encouraged regular exercise and weight loss when appropriate.   - Will continue to monitor and re-check as recommended.  Recommendations - Last lab work done 01/21/2019. - Discussed re-check every 6 months.   - As part of my medical decision making, I reviewed the following data within the [place] History obtained from pt /family, CMA notes reviewed and incorporated if applicable, Labs reviewed, Radiograph/ tests reviewed if applicable and OV notes from prior OV's with me, as well as other specialists she/he has seen since seeing me last, were all reviewed and used in my medical decision making process today.   - Additionally, discussion had with patient regarding txmnt plan, and their biases/concerns about that plan were used in my medical decision making today.   - The patient agreed with the plan and demonstrated an understanding of the instructions.   No barriers to understanding were identified.   - Red flag symptoms and signs  discussed in detail.  Patient expressed understanding regarding what to do in case of emergency\ urgent symptoms.  The patient was advised to call back or seek an in-person evaluation if the symptoms worsen or if the condition fails to improve as anticipated.   No follow-ups on file.    No orders of the defined types were placed in this encounter.   Meds ordered this encounter  Medications  . budesonide-formoterol (SYMBICORT) 80-4.5 MCG/ACT inhaler    Sig: Inhale 2 puffs into the lungs 2 (two) times daily.    Dispense:  1 Inhaler    Refill:  1    Medications Discontinued During This Encounter  Medication Reason  . loratadine (CLARITIN) 10 MG tablet Change in therapy      I provided 23 minutes of non face-to-face time during this encounter.  Additional time was spent with charting and coordination of care after the actual visit commenced.   Note:  This note was prepared with assistance of Dragon voice recognition software. Occasional wrong-word or sound-a-like substitutions may have occurred due to the inherent limitations of voice recognition software.  Mellody Dance, DO   This document serves as a record of services personally performed by Mellody Dance, DO. It was created on her behalf by Toni Amend, a trained medical scribe. The creation of this record is based on the scribe's personal observations and the provider's statements to them.   I have reviewed the above medical documentation for accuracy and completeness and I concur.  Mellody Dance, DO 04/10/2019 8:03 PM     Patient Care Team    Relationship Specialty Notifications Start End  Mellody Dance, DO PCP - General Family Medicine  08/05/17   Daryll Brod, [place] Physician Orthopedic Surgery  08/05/17    Comment: L second knuckle clicking/ pain  [place], Peter M, MD Consulting Physician Cardiology  08/05/17   Jarome Matin, MD Consulting Physician Dermatology  08/05/17   Festus Aloe, MD  Consulting Physician Urology  08/05/17   Dyke Maes, [place] Referring Physician Optometry  08/05/17      -Vitals obtained; medications/ allergies reconciled;  personal medical, social, Sx etc.histories were updated by CMA, reviewed by me and are reflected in  chart   Patient Active Problem List   Diagnosis Date Noted  . Statin declined 04/08/2019    Priority: High  . Glucose intolerance (impaired glucose tolerance) 08/29/2017    Priority: High  . Hyperlipidemia 09/28/2012    Priority: High  . Reactive airway disease with wheezing- dogs and cats only 06/21/2010    Priority: High  . Vitamin D deficiency 08/29/2017    Priority: Low  . Melanoma of skin ([place])- nose 04/08/2019  . Dupuytren's contracture of hand 01/27/2019  . Allergy   . Environmental allergies 08/29/2017  . Dupuytren's contracture of both hands- sees ortho 08/05/2017  . History of back surgery- 1980's. 08/05/2017  . Medicare annual wellness visit, subsequent 07/23/2016  . Urinary frequency 05/25/2015  . Allergic rhinitis 06/21/2010  . FASTING HYPERGLYCEMIA 10/03/2008  . CONDUCTIVE HEARING LOSS BILATERAL 03/17/2008  . SYNCOPE 03/17/2008     Current Meds  Medication Sig  . Cholecalciferol (VITAMIN D3) 2000 units capsule Take 1 capsule (2,000 Units total) by mouth daily.  . Lutein 6 MG CAPS Take by mouth daily.  . montelukast (SINGULAIR) 10 MG tablet   . PROAIR HFA 108 (90 Base) MCG/ACT inhaler INHALE 1 TO 2 PUFFS INTO THE LUNGS EVERY 4 HOURS AS NEEDED.  . [DISCONTINUED] loratadine (CLARITIN) 10 MG tablet TAKE 1 TABLET BY MOUTH EVERY DAY AS NEEDED FOR ALLERGIES     Allergies:  Allergies  Allergen Reactions  . Dog Epithelium Allergy Skin Test Shortness Of Breath    Other reaction(s): Respiratory distress  . Cat Hair Extract     Other reaction(s): Respiratory Distress  . Flovent Hfa [Fluticasone]     Throat burning and felt swollen  . Other     Other reaction(s): Respiratory Distress Other reaction(s):  Respiratory distress     ROS:  See above HPI for pertinent positives and negatives   Objective:   Blood pressure 122/69, pulse 68, temperature (!) 96.5 F (35.8 C), height 6' (1.829 m), weight 197 lb (89.4 kg).  (if some vitals are omitted, this means that patient was UNABLE to obtain them even though they were asked to get them prior to OV today.  They were asked to call us at their earliest convenience with these once obtained. )  General: A & O * 3; sounds in no acute distress; in usual state of health.  Skin: Pt confirms warm and dry extremities and pink fingertips HEENT: Pt confirms lips non-cyanotic Chest: Patient confirms normal chest excursion and movement Respiratory: speaking in full sentences, no conversational dyspnea; patient confirms no use of accessory muscles Psych: insight appears good, mood- appears full

## 2019-04-23 ENCOUNTER — Encounter: Payer: Self-pay | Admitting: Family Medicine

## 2019-04-23 ENCOUNTER — Other Ambulatory Visit: Payer: Self-pay | Admitting: Family Medicine

## 2019-04-23 DIAGNOSIS — J4540 Moderate persistent asthma, uncomplicated: Secondary | ICD-10-CM

## 2019-04-23 MED ORDER — BUDESONIDE-FORMOTEROL FUMARATE 80-4.5 MCG/ACT IN AERO: 2.0000 | INHALATION_SPRAY | Freq: Two times a day (BID) | RESPIRATORY_TRACT | 5 refills | Status: DC

## 2019-05-11 ENCOUNTER — Encounter: Payer: Self-pay | Admitting: Family Medicine

## 2019-05-28 ENCOUNTER — Other Ambulatory Visit: Payer: Self-pay | Admitting: Family Medicine

## 2019-06-22 ENCOUNTER — Encounter: Payer: Self-pay | Admitting: Family Medicine

## 2019-06-24 ENCOUNTER — Encounter: Payer: Self-pay | Admitting: Adult Health

## 2019-06-24 ENCOUNTER — Other Ambulatory Visit: Payer: Self-pay

## 2019-06-24 ENCOUNTER — Ambulatory Visit (INDEPENDENT_AMBULATORY_CARE_PROVIDER_SITE_OTHER): Payer: PPO | Admitting: Adult Health

## 2019-06-24 VITALS — Temp 97.7°F

## 2019-06-24 DIAGNOSIS — R351 Nocturia: Secondary | ICD-10-CM | POA: Diagnosis not present

## 2019-06-24 DIAGNOSIS — R300 Dysuria: Secondary | ICD-10-CM | POA: Diagnosis not present

## 2019-06-24 DIAGNOSIS — N400 Enlarged prostate without lower urinary tract symptoms: Secondary | ICD-10-CM | POA: Insufficient documentation

## 2019-06-24 LAB — POCT URINALYSIS DIPSTICK
Bilirubin, UA: NEGATIVE
Blood, UA: NEGATIVE
Glucose, UA: NEGATIVE
Ketones, UA: NEGATIVE
Leukocytes, UA: NEGATIVE
Nitrite, UA: NEGATIVE
Protein, UA: NEGATIVE
Spec Grav, UA: 1.025 (ref 1.010–1.025)
Urobilinogen, UA: 0.2 E.U./dL
pH, UA: 6.5 (ref 5.0–8.0)

## 2019-06-24 NOTE — Assessment & Plan Note (Signed)
Assessment and Plan: 1 week of Nocturia- gets up at-0200, 0400, 0600. Follow-up with established Urologist Reviewed UA results at length Increase plain water intake Continue to social distance and wear a mask when in public  Follow Up Instructions: PRN   I discussed the assessment and treatment plan with the patient. The patient was provided an opportunity to ask questions and all were answered. The patient agreed with the plan and demonstrated an understanding of the instructions.   The patient was advised to call back or seek an in-person evaluation if the symptoms worsen or if the condition fails to improve as anticipated.

## 2019-06-24 NOTE — Progress Notes (Signed)
Virtual Visit via Telephone Note  I connected with Jeremy Li on 06/24/19 at 10:15 AM EST by telephone and verified that I am speaking with the correct person using two identifiers.  Location: Patient: [facility]    I discussed the limitations, risks, security and privacy concerns of performing an evaluation and management service by telephone and the availability of in person appointments. I also discussed with the patient that there may be a patient responsible charge related to this service. The patient expressed understanding and agreed to proceed.   History of Present Illness: Jeremy Li calls in with complaints urinary frequency for 1 week, specifically increase in nocturia- Normally he would not need to get up to urinate during sleep, now he gets up at-0200, 0400, 0600. He consumes -2.5 x 6 oz cups of Coffee in am- will have to urinate 30 mins afterwards Milk at lunch 6 oz wine with dinner, 6 oz water with dinner He drinks only sips of water/day  He purchased OTC UTI kit that indicated her was + for nitrites  He denies fever/flank pain/N/V/D He denies N/V/D He denies hematuria/hemaochezia 97.7 temp  He used OTC home  UA: Blood- Neg Nitrite- Neg Leu-Neg  He has established Urologist/Dr. Junious Silk- advised to f/u with that specialist for nocturia   Patient Care Team    Relationship Specialty Notifications Start End  Mellody Dance, DO PCP - General Family Medicine  08/05/17   Daryll Brod, MD Consulting Physician Orthopedic Surgery  08/05/17    Comment: L second knuckle clicking/ pain  [place], Peter M, MD Consulting Physician Cardiology  08/05/17   Jarome Matin, MD Consulting Physician Dermatology  08/05/17   Festus Aloe, MD Consulting Physician Urology  08/05/17   Dyke Maes, [place] Referring Physician Optometry  08/05/17     Patient Active Problem List   Diagnosis Date Noted  . Nocturia 06/24/2019  . Statin declined 04/08/2019  . Melanoma of skin  ([place])- nose 04/08/2019  . Dupuytren's contracture of hand 01/27/2019  . Allergy   . Glucose intolerance (impaired glucose tolerance) 08/29/2017  . Environmental allergies 08/29/2017  . Vitamin D deficiency 08/29/2017  . Dupuytren's contracture of both hands- sees ortho 08/05/2017  . History of back surgery- 1980's. 08/05/2017  . Medicare annual wellness visit, subsequent 07/23/2016  . Urinary frequency 05/25/2015  . Hyperlipidemia 09/28/2012  . Allergic rhinitis 06/21/2010  . Reactive airway disease with wheezing- dogs and cats only 06/21/2010  . FASTING HYPERGLYCEMIA 10/03/2008  . CONDUCTIVE HEARING LOSS BILATERAL 03/17/2008  . SYNCOPE 03/17/2008     Past Medical History:  Diagnosis Date  . Allergy    pets & seasonal allergens     Past Surgical History:  Procedure Laterality Date  . CATARACT EXTRACTION, BILATERAL     Dr Shamarcus Hoheisel Fitch  . DUPUYTREN / PALMAR FASCIOTOMY     Dr Daylene Katayama  . HERNIA REPAIR    . LUMBAR DISC SURGERY  1989   Dr Cheri Rous  . TONSILLECTOMY       Family History  Problem Relation Age of Onset  . Heart disease Sister        cardiomegaly  . COPD Neg Hx   . Asthma Neg Hx   . Cancer Neg Hx   . Diabetes Neg Hx   . Stroke Neg Hx      Social History   Substance and Sexual Activity  Drug Use No     Social History   Substance and Sexual Activity  Alcohol Use Yes  .  Alcohol/week: 5.0 standard drinks  . Types: 5 Standard drinks or equivalent per week     Social History   Tobacco Use  Smoking Status Former Smoker  . Packs/day: 4.00  . Years: 0.50  . Pack years: 2.00  . Quit date: 07/22/1961  . Years since quitting: 57.9  Smokeless Tobacco Never Used  Tobacco Comment   smoked pipe 1-2 bowls/ day ages [age]     Outpatient Encounter Medications as of 06/24/2019  Medication Sig  . budesonide-formoterol (SYMBICORT) 80-4.5 MCG/ACT inhaler Inhale 2 puffs into the lungs 2 (two) times daily.  . Cholecalciferol (VITAMIN D3) 2000 units capsule Take  1 capsule (2,000 Units total) by mouth daily.  . Lutein 6 MG CAPS Take by mouth daily.  . montelukast (SINGULAIR) 10 MG tablet TAKE 1 TABLET BY MOUTH AT BEDTIME.  Marland Kitchen PROAIR HFA 108 (90 Base) MCG/ACT inhaler INHALE 1 TO 2 PUFFS INTO THE LUNGS EVERY 4 HOURS AS NEEDED.   No facility-administered encounter medications on file as of 06/24/2019.     Allergies: Dog epithelium allergy skin test, Cat hair extract, Flovent hfa [fluticasone], and Other  There is no height or weight on file to calculate BMI.  Temperature 97.7 F (36.5 C). Review of Systems: General:   Denies fever, chills, unexplained weight loss.  Optho/Auditory:   Denies visual changes, blurred vision/LOV Respiratory:   Denies SOB, DOE more than baseline levels.  Cardiovascular:   Denies chest pain, palpitations, new onset peripheral edema  Gastrointestinal:   Denies nausea, vomiting, diarrhea.  Genitourinary: Denies dysuria, urgency, flank pain or discharge from genitals.  Nocturia + Endocrine:     Denies hot or cold intolerance, polyuria, polydipsia. Musculoskeletal:   Denies unexplained myalgias, joint swelling, unexplained arthralgias, gait problems.  Skin:  Denies rash, suspicious lesions Neurological:     Denies dizziness, unexplained weakness, numbness  Psychiatric/Behavioral:   Denies mood changes, suicidal or homicidal ideations, hallucinations Observations/Objective: No acute distress noted during telephone conversation  Assessment and Plan: 1 week of Nocturia- gets up at-0200, 0400, 0600. Follow-up with established Urologist Reviewed UA results at length Increase plain water intake Continue to social distance and wear a mask when in public  Follow Up Instructions: PRN   I discussed the assessment and treatment plan with the patient. The patient was provided an opportunity to ask questions and all were answered. The patient agreed with the plan and demonstrated an understanding of the instructions.   The  patient was advised to call back or seek an in-person evaluation if the symptoms worsen or if the condition fails to improve as anticipated.  I provided 15 minutes of non-face-to-face time during this encounter.   Esaw Grandchild, NP

## 2019-07-12 ENCOUNTER — Encounter: Payer: Self-pay | Admitting: Family Medicine

## 2019-07-13 ENCOUNTER — Other Ambulatory Visit: Payer: Self-pay | Admitting: Family Medicine

## 2019-07-13 ENCOUNTER — Ambulatory Visit
Admission: RE | Admit: 2019-07-13 | Discharge: 2019-07-13 | Disposition: A | Discharge status: Home / Self Care | Payer: PPO | Source: Ambulatory Visit | Attending: Family Medicine | Admitting: Family Medicine

## 2019-07-13 DIAGNOSIS — S99921A Unspecified injury of right foot, initial encounter: Secondary | ICD-10-CM

## 2019-07-13 DIAGNOSIS — L97519 Non-pressure chronic ulcer of other part of right foot with unspecified severity: Secondary | ICD-10-CM

## 2019-07-13 NOTE — Progress Notes (Signed)
Please see patient note from yesterday at 3:12 PM.  Also there are attached pictures.  " Hi Dr. Raliegh Scarlet, I think you might want to look at this. I fell out of bed 07/07/19, got my right foot tangled between the bed rail and floor, while mostly asleep. Woke up as my ankle twisted free. Nothing broken, no penetration of the skin, but very sore.  Next day ankle is swollen, bruised, and developing blisters on bruises. Blisters have since broken, I am applying anti-bacterial ointment (Neosporin), cleaning with alcohol twice daily, changing dressing twice a day. Bruises painful to pressure, but I walk and drive without problem. See photo attached. What would you like to do? I can be available about any time for an appointment. Thanks for your help.  Jeremy Li "  I will see him via a Doxy visit tomorrow to discuss his injury/ sx. referral for x-ray will be placed today and asked patient to go get this today so we will have results for visit tomorrow

## 2019-07-14 ENCOUNTER — Other Ambulatory Visit: Payer: Self-pay

## 2019-07-14 ENCOUNTER — Ambulatory Visit (INDEPENDENT_AMBULATORY_CARE_PROVIDER_SITE_OTHER): Payer: PPO | Admitting: Family Medicine

## 2019-07-14 ENCOUNTER — Encounter: Payer: Self-pay | Admitting: Family Medicine

## 2019-07-14 VITALS — BP 134/74 | HR 61 | Temp 98.0°F | Ht 73.0 in | Wt 197.0 lb

## 2019-07-14 DIAGNOSIS — X58XXXA Exposure to other specified factors, initial encounter: Secondary | ICD-10-CM | POA: Diagnosis not present

## 2019-07-14 DIAGNOSIS — R238 Other skin changes: Secondary | ICD-10-CM

## 2019-07-14 DIAGNOSIS — T148XXA Other injury of unspecified body region, initial encounter: Secondary | ICD-10-CM

## 2019-07-14 DIAGNOSIS — S99921A Unspecified injury of right foot, initial encounter: Secondary | ICD-10-CM | POA: Diagnosis not present

## 2019-07-14 MED ORDER — SILVER SULFADIAZINE 1 % EX CREA: 1.0000 "application " | TOPICAL_CREAM | Freq: Every day | CUTANEOUS | 0 refills | Status: DC

## 2019-07-14 NOTE — Progress Notes (Signed)
Telehealth office visit note for Jeremy Li, D.O- at Primary Care at [facility]   I connected with current patient today and verified that I am speaking with the correct person using two identifiers.   . Location of the patient: Home . Location of the provider: Office Only the patient (+/- their family members at pt's discretion) and myself were participating in the encounter - This visit type was conducted due to national recommendations for restrictions regarding the COVID-19 Pandemic (e.g. social distancing) in an effort to limit this patient's exposure and mitigate transmission in our community.  This format is felt to be most appropriate for this patient at this time.   - The patient did not have access to video technology or had technical difficulties with video requiring transitioning to audio format only. - No physical exam could be performed with this format, beyond that communicated to [place] by the patient/ family members as noted.   - Additionally my office staff/ schedulers discussed with the patient that there may be a monetary charge related to this service, depending on their medical insurance.   The patient expressed understanding, and agreed to proceed.        History of Present Illness: Bleeding/Bruising (bruising on right ankle- started 1 week ago. Fell OOB and right foot twisted and bruised the skin. States he sent a email with photos to look at. Blistering around the ankle that have broken. Able to walk and drive. Done xrays yesterday which are normal.)   I, Jeremy Li, am serving as scribe for Dr. Mellody Li.   Says he's "doing pretty well for a guy my age."  - Recent R Foot Injury, Blisters  -Patient had ankle x-rays yesterday  Injured himself on 2 AM Wednesday night / Thursday morning a week ago, December 16-17, and the blisters began showing up about 12 hours later.    Underneath the bed is a steep rail that supports the mattress, about 4  inches above the surface of the floor.  He fell off the bed while sleeping and caught his right ankle underneath, and thinks that in his panic, he twisted and turned his ankle to get free.  In the process, he woke his wife up.  Says he thinks he mainly bruised / injured himself on the steel bed rail.  Notes he went to [facility] and obtained X-ray as referred.  Says he read the e-mail this morning and saw there was no fracture.  He was concerned about a bone break due to his swelling, and also a past experience with broken bones.  Notes his main concern is the blistering in the area, and what we would suggest to treat this.  Says he has been elevating his foot over the last several days, because he does have time to Jeremy Li this at home.  He has been cleaning his ankle, and wiping with alcohol around the wound area of the blister.  Says "I have these large blisters which are like a sunburn, and have been cleaning around them with alcohol and placing gauze on them with bandages to dress the wound."  He then applies neosporin to the area, and is taking aspirin in addition.  He has been able to walk and drive without difficulty.     GAD 7 : Generalized Anxiety Score 01/06/2019  Nervous, Anxious, on Edge 0  Control/stop worrying 0  Worry too much - different things 0  Trouble relaxing 0  Restless 0  Easily annoyed or irritable 0  Afraid - awful might happen 0  Total GAD 7 Score 0  Anxiety Difficulty Not difficult at all    Depression screen [facility] 2/9 06/24/2019 01/06/2019 07/07/2018 02/24/2018 01/06/2018  Decreased Interest 0 0 0 0 0  Down, Depressed, Hopeless 0 0 0 0 0  PHQ - 2 Score 0 0 0 0 0  Altered sleeping 0 0 0 0 0  Tired, decreased energy 0 0 0 0 0  Change in appetite 0 0 0 0 0  Feeling bad or failure about yourself  0 0 0 0 0  Trouble concentrating 0 0 0 0 0  Moving slowly or fidgety/restless 0 0 0 0 0  Suicidal thoughts 0 0 0 0 0  PHQ-9 Score 0 0 0 0 0  Difficult doing  work/chores - Not difficult at all Not difficult at all Not difficult at all Not difficult at all      Impression and Recommendations:    1. Right foot injury, initial encounter   2. Blister of skin      R Foot Injury, Blister of Skin - Reviewed that patient's recent imaging returned with no sign of fracture.  - Discussed patient's symptoms at length today.  Reviewed typical formation of blisters after injury / swelling.  Education provided and all questions answered.  - Explained importance of appropriately elevating the foot / site of injury to help reduce swelling.  Patient knows to recline on a couch and elevate the foot above the level of the heart.  - Extensively reviewed appropriate wound care with patient. - Told patient to stop using alcohol / hydrogen peroxide on the area. - Silver sulfadiazine provided today.  Discussed use with patient today.  - Told patient to use soap (such as Dial) and water to clean the area, apply silver sulfadiazine, and then lightly wrap the area with a light gauze / non-stick pad.  If patient is concerned about cleaning the wound with Dial alone, told patient to use Chlorhexidine.  - Advised that patient may leave the wound site open to air at home, in a clean environment.  Any time he is up and moving, going outside, or anywhere the wound might get dirty, he should keep the area covered.  - Reviewed expectations for wound healing.  Told patient to expect pinkness and granulation tissue as wound is healing, but not redness.  Discussed that redness spreading outward from sites of injury / blisters is never normal.  - Told patient to avoid banging his foot around in the night or otherwise hitting the area.  - Patient knows to avoid placing pressure on the area as wound is healing.  - Will continue to monitor.   - As part of my medical decision making, I reviewed the following data within the [place] History obtained from pt  /family, CMA notes reviewed and incorporated if applicable, Labs reviewed, Radiograph/ tests reviewed if applicable and OV notes from prior OV's with me, as well as other specialists she/he has seen since seeing me last, were all reviewed and used in my medical decision making process today.    - Additionally, discussion had with patient regarding our treatment plan, and their biases/concerns about that plan were used in my medical decision making today.    - The patient agreed with the plan and demonstrated an understanding of the instructions.   No barriers to understanding were identified.    - Red flag symptoms and signs  discussed in detail.  Patient expressed understanding regarding what to Jeremy Li in case of emergency\ urgent symptoms.   - The patient was advised to call back or seek an in-person evaluation if the symptoms worsen or if the condition fails to improve as anticipated.   Return for F-up of current med issues as previously d/c pt.     Meds ordered this encounter  Medications  . silver sulfADIAZINE (SILVADENE) 1 % cream    Sig: Apply 1 application topically daily.    Dispense:  50 g    Refill:  0    I provided 15 minutes of non face-to-face time during this encounter.  Additional time was spent with charting and coordination of care before and after the actual visit commenced.   Note:  This note was prepared with assistance of Dragon voice recognition software. Occasional wrong-word or sound-a-like substitutions may have occurred due to the inherent limitations of voice recognition software.  This document serves as a record of services personally performed by Jeremy Dance, Jeremy Li. It was created on her behalf by Jeremy Li, a trained medical scribe. The creation of this record is based on the scribe's personal observations and the provider's statements to them.   This case required medical decision making of at least moderate complexity. The above documentation has  been reviewed to be accurate and was completed by Marjory Sneddon, D.O.       Patient Care Team    Relationship Specialty Notifications Start End  Jeremy Dance, Jeremy Li PCP - General Family Medicine  08/05/17   Daryll Brod, MD Consulting Physician Orthopedic Surgery  08/05/17    Comment: L second knuckle clicking/ pain  Martinique, Peter M, MD Consulting Physician Cardiology  08/05/17   Jarome Matin, MD Consulting Physician Dermatology  08/05/17   Festus Aloe, MD Consulting Physician Urology  08/05/17   Dyke Maes, [place] Referring Physician Optometry  08/05/17      -Vitals obtained; medications/ allergies reconciled;  personal medical, social, Sx etc.histories were updated by CMA, reviewed by me and are reflected in chart   Patient Active Problem List   Diagnosis Date Noted  . Statin declined 04/08/2019  . Glucose intolerance (impaired glucose tolerance) 08/29/2017  . Hyperlipidemia 09/28/2012  . Reactive airway disease with wheezing- dogs and cats only 06/21/2010  . Vitamin D deficiency 08/29/2017  . Nocturia 06/24/2019  . Melanoma of skin ([place])- nose 04/08/2019  . Dupuytren's contracture of hand 01/27/2019  . Allergy   . Environmental allergies 08/29/2017  . Dupuytren's contracture of both hands- sees ortho 08/05/2017  . History of back surgery- 1980's. 08/05/2017  . Medicare annual wellness visit, subsequent 07/23/2016  . Urinary frequency 05/25/2015  . Allergic rhinitis 06/21/2010  . FASTING HYPERGLYCEMIA 10/03/2008  . CONDUCTIVE HEARING LOSS BILATERAL 03/17/2008  . SYNCOPE 03/17/2008     Current Meds  Medication Sig  . aspirin 325 MG tablet Take 325 mg by mouth every 6 (six) hours as needed.  . budesonide-formoterol (SYMBICORT) 80-4.5 MCG/ACT inhaler Inhale 2 puffs into the lungs 2 (two) times daily.  . Cholecalciferol (VITAMIN D3) 2000 units capsule Take 1 capsule (2,000 Units total) by mouth daily.  . Lutein 6 MG CAPS Take by mouth daily.  . montelukast  (SINGULAIR) 10 MG tablet TAKE 1 TABLET BY MOUTH AT BEDTIME.  Marland Kitchen PROAIR HFA 108 (90 Base) MCG/ACT inhaler INHALE 1 TO 2 PUFFS INTO THE LUNGS EVERY 4 HOURS AS NEEDED.     Allergies:  Allergies  Allergen Reactions  .  Dog Epithelium Allergy Skin Test Shortness Of Breath    Other reaction(s): Respiratory distress  . Cat Hair Extract     Other reaction(s): Respiratory Distress  . Flovent Hfa [Fluticasone]     Throat burning and felt swollen  . Other     Other reaction(s): Respiratory Distress Other reaction(s): Respiratory distress     ROS:  See above HPI for pertinent positives and negatives   Objective:   Blood pressure 134/74, pulse 61, temperature 98 F (36.7 C), temperature source Oral, height 6\' 1"  (1.854 m), weight 197 lb (89.4 kg).  (if some vitals are omitted, this means that patient was UNABLE to obtain them even though they were asked to get them prior to OV today.  They were asked to call us at their earliest convenience with these once obtained. )  General: A & O * 3; sounds in no acute distress; in usual state of health.  Skin: Pt confirms warm and dry extremities and pink fingertips HEENT: Pt confirms lips non-cyanotic Chest: Patient confirms normal chest excursion and movement Respiratory: speaking in full sentences, no conversational dyspnea; patient confirms no use of accessory muscles Psych: insight appears good, mood- appears full

## 2019-11-12 ENCOUNTER — Encounter: Payer: Self-pay | Admitting: Family Medicine

## 2019-11-12 ENCOUNTER — Other Ambulatory Visit: Payer: Self-pay | Admitting: Family Medicine

## 2019-12-25 ENCOUNTER — Other Ambulatory Visit: Payer: Self-pay | Admitting: Family Medicine

## 2019-12-28 ENCOUNTER — Telehealth: Payer: Self-pay | Admitting: Physician Assistant

## 2019-12-28 MED ORDER — MONTELUKAST SODIUM 10 MG PO TABS: 10.0000 mg | ORAL_TABLET | Freq: Every day | ORAL | 0 refills | Status: DC

## 2019-12-28 NOTE — Telephone Encounter (Signed)
Patient is requesting a refill of his Singulair, if approved please send to [facility].

## 2019-12-28 NOTE — Telephone Encounter (Signed)
Refill sent to pharmacy. AS, CMA 

## 2019-12-28 NOTE — Addendum Note (Signed)
Addended by: Mickel Crow on: 12/28/2019 01:51 PM   Modules accepted: Orders

## 2020-01-18 ENCOUNTER — Other Ambulatory Visit: Payer: Self-pay

## 2020-01-18 ENCOUNTER — Ambulatory Visit (INDEPENDENT_AMBULATORY_CARE_PROVIDER_SITE_OTHER): Payer: PPO | Admitting: Physician Assistant

## 2020-01-18 ENCOUNTER — Encounter: Payer: Self-pay | Admitting: Physician Assistant

## 2020-01-18 VITALS — BP 136/73 | HR 59 | Ht 73.0 in | Wt 200.0 lb

## 2020-01-18 DIAGNOSIS — Z9109 Other allergy status, other than to drugs and biological substances: Secondary | ICD-10-CM | POA: Diagnosis not present

## 2020-01-18 DIAGNOSIS — J4540 Moderate persistent asthma, uncomplicated: Secondary | ICD-10-CM

## 2020-01-18 DIAGNOSIS — Z0000 Encounter for general adult medical examination without abnormal findings: Secondary | ICD-10-CM

## 2020-01-18 NOTE — Progress Notes (Signed)
Virtual Visit via Telephone Note:  I connected with Shanon Brow L. Kucinski by telephone and verified that I am speaking with the correct person using two identifiers.    I discussed the limitations, risks, security and privacy concerns for performing an evaluation and management service by telephone and the availability of in person appointments. The staff discussed with patient that there may be a patient responsible charge related to this service. The patient expressed understanding and agreed to proceed.   Location of Patient- Home Location of Provider- Office    Subjective:   Jeremy Li is a 79 y.o. male who presents for Medicare Annual/Subsequent preventive examination.  Review of Systems    Review of Systems: General:   No F/C, wt loss Pulm:   No DIB, SOB, pleuritic chest pain Card:  No CP, palpitations Abd:  No n/v/d or pain Ext:  No inc edema from baseline    Objective:    Today's Vitals   01/18/20 1322  BP: 136/73  Pulse: (!) 59  Weight: 200 lb (90.7 kg)  Height: 6\' 1"  (1.854 m)   Body mass index is 26.39 kg/m.  Advanced Directives 08/05/2017  Does Patient Have a Medical Advance Directive? Yes  Type of Advance Directive Living will;Healthcare Power of [place] in Chart? No - copy requested    Current Medications (verified) Outpatient Encounter Medications as of 01/18/2020  Medication Sig  . budesonide-formoterol (SYMBICORT) 80-4.5 MCG/ACT inhaler Inhale 2 puffs into the lungs 2 (two) times daily.  . Cholecalciferol (VITAMIN D3) 2000 units capsule Take 1 capsule (2,000 Units total) by mouth daily.  Marland Kitchen loratadine (CLARITIN) 10 MG tablet Take 10 mg by mouth daily.  . montelukast (SINGULAIR) 10 MG tablet Take 1 tablet (10 mg total) by mouth at bedtime. **PATIENT NEEDS APT FOR FURTHER REFILLS**  . PROAIR HFA 108 (90 Base) MCG/ACT inhaler INHALE 1 TO 2 PUFFS INTO THE LUNGS EVERY 4 HOURS AS NEEDED.  . [DISCONTINUED] aspirin 325 MG tablet  Take 325 mg by mouth every 6 (six) hours as needed.  . [DISCONTINUED] Lutein 6 MG CAPS Take by mouth daily.  . [DISCONTINUED] silver sulfADIAZINE (SILVADENE) 1 % cream Apply 1 application topically daily.   No facility-administered encounter medications on file as of 01/18/2020.    Allergies (verified) Dog epithelium allergy skin test, Cat hair extract, Flovent hfa [fluticasone], and Other   History: Past Medical History:  Diagnosis Date  . Allergy    pets & seasonal allergens   Past Surgical History:  Procedure Laterality Date  . CATARACT EXTRACTION, BILATERAL     Dr Katy Fitch  . DUPUYTREN / PALMAR FASCIOTOMY     Dr Daylene Katayama  . EYE SURGERY N/A    [employer] 01/17/2020  . HERNIA REPAIR    . LUMBAR DISC SURGERY  1989   Dr Cheri Rous  . TONSILLECTOMY     Family History  Problem Relation Age of Onset  . Heart disease Sister        cardiomegaly  . COPD Neg Hx   . Asthma Neg Hx   . Cancer Neg Hx   . Diabetes Neg Hx   . Stroke Neg Hx    Social History   Socioeconomic History  . Marital status: Married    Spouse name: Not on file  . Number of children: 3  . Years of education: 30  . Highest education level: Not on file  Occupational History  . Occupation: Retired  Tobacco Use  .  Smoking status: Former Smoker    Packs/day: 4.00    Years: 0.50    Pack years: 2.00    Quit date: 07/22/1961    Years since quitting: 58.5  . Smokeless tobacco: Never Used  . Tobacco comment: smoked pipe 1-2 bowls/ day ages [age]  Vaping Use  . Vaping Use: Never used  Substance and Sexual Activity  . Alcohol use: Yes    Alcohol/week: 5.0 standard drinks    Types: 5 Standard drinks or equivalent per week  . Drug use: No  . Sexual activity: Yes    Birth control/protection: None  Other Topics Concern  . Not on file  Social History Narrative   Denies abuse and feels safe at home.    Social Determinants of Health   Financial Resource Strain:   . Difficulty of Paying Living Expenses:   Food  Insecurity:   . Worried About Charity fundraiser in the Last Year:   . Arboriculturist in the Last Year:   Transportation Needs:   . Film/video editor (Medical):   Marland Kitchen Lack of Transportation (Non-Medical):   Physical Activity:   . Days of Exercise per Week:   . Minutes of Exercise per Session:   Stress:   . Feeling of Stress :   Social Connections:   . Frequency of Communication with Friends and Family:   . Frequency of Social Gatherings with Friends and Family:   . Attends Religious Services:   . Active Member of Clubs or Organizations:   . Attends Archivist Meetings:   Marland Kitchen Marital Status:     Tobacco Counseling Counseling given: Not Answered Comment: smoked pipe 1-2 bowls/ day ages [age]   Diabetic? no   Activities of Daily Living In your present state of health, do you have any difficulty performing the following activities: 01/18/2020  Hearing? N  Difficulty concentrating or making decisions? N  Walking or climbing stairs? N  Dressing or bathing? N  Doing errands, shopping? N  Some recent data might be hidden    Patient Care Team: Lorrene Reid, PA-C as PCP - General Daryll Brod, MD as Consulting Physician (Orthopedic Surgery) [place]ique, Peter M, MD as Consulting Physician (Cardiology) Jarome Matin, MD as Consulting Physician (Dermatology) Festus Aloe, MD as Consulting Physician (Urology) Dyke Maes, OD as Referring Physician (Optometry)  Indicate any recent Medical Services you may have received from other than [facility] providers in the past year (date may be approximate).     Assessment:   This is a routine wellness examination for Jeremy Li.  Hearing/Vision screen No exam data present  Dietary issues and exercise activities discussed: - Continue to follow balanced diet of lean meats (chicken, fish), vegetables and fruit, and cooking with olive oil. - Continue walks around neighborhood as manageable.  Goals   None    Depression  Screen PHQ 2/9 Scores 01/18/2020 06/24/2019 01/06/2019 07/07/2018 02/24/2018 01/06/2018 08/29/2017  PHQ - 2 Score 0 0 0 0 0 0 0  PHQ- 9 Score 0 0 0 0 0 0 0    Fall Risk Fall Risk  01/18/2020 07/07/2018 01/06/2018 08/05/2017 07/23/2016  Falls in the past year? 1 0 No No No  Number falls in past yr: 0 - - - -  Injury with Fall? 1 - - - -  Risk for fall due to : History of fall(s) - - - -  Follow up Falls evaluation completed - - - -    Any stairs in or around  the home? No  If so, are there any without handrails? NA Home free of loose throw rugs in walkways, pet beds, electrical cords, etc? Yes  Adequate lighting in your home to reduce risk of falls? Yes   ASSISTIVE DEVICES UTILIZED TO PREVENT FALLS:  Life alert? No  Use of a cane, walker or w/c? No  Grab bars in the bathroom? No  Shower chair or bench in shower? Yes  Elevated toilet seat or a handicapped toilet? No   TIMED UP AND GO:  Was the test performed? No .   Pt not in the office    Cognitive Function: WNL     6CIT Screen 01/18/2020 07/07/2018  What Year? 0 points 0 points  What month? 0 points 0 points  What time? 0 points 0 points  Count back from 20 0 points 0 points  Months in reverse 0 points 0 points  Repeat phrase 0 points 0 points  Total Score 0 0    Immunizations Immunization History  Administered Date(s) Administered  . Influenza Whole 05/25/2008, 05/22/2009, 04/21/2012  . Influenza, High Dose Seasonal PF 04/21/2014, 05/29/2015, 04/23/2017, 04/29/2018, 05/10/2019  . Influenza, Seasonal, Injecte, Preservative Fre 05/07/2013  . Influenza,inj,Quad PF,6+ Mos 04/23/2017  . Influenza-Unspecified 05/29/2015, 04/23/2017, 05/10/2018  . PFIZER SARS-COV-2 Vaccination 08/11/2019, 09/01/2019  . Pneumococcal Conjugate-13 04/21/2014  . Pneumococcal Polysaccharide-23 05/29/2015  . Td 09/27/2008  . Tdap 01/08/2019  . Zoster 03/29/2014  . Zoster Recombinat (Shingrix) 06/28/2017, 10/07/2017    TDAP status: Due, Education  has been provided regarding the importance of this vaccine. Advised may receive this vaccine at local pharmacy or Health Dept. Aware to provide a copy of the vaccination record if obtained from local pharmacy or Health Dept. Verbalized acceptance and understanding. Flu Vaccine status: Up to date Pneumococcal vaccine status: Up to date Covid-19 vaccine status: Completed vaccines  Qualifies for Shingles Vaccine? Yes   Zostavax completed Yes   Shingrix Completed?: Yes  Screening Tests Health Maintenance  Topic Date Due  . INFLUENZA VACCINE  02/20/2020  . TETANUS/TDAP  01/07/2029  . COVID-19 Vaccine  Completed  . PNA vac Low Risk Adult  Completed    Health Maintenance  There are no preventive care reminders to display for this patient.  Colorectal cancer screening: No longer required.   Lung Cancer Screening: (Low Dose CT Chest recommended if Age 17-80 years, 30 pack-year currently smoking OR have quit w/in 15years.) does not qualify.    Additional Screening:  Hepatitis C Screening: does qualify; Completed per pt, unknown   Vision Screening: Recommended annual ophthalmology exams for early detection of glaucoma and other disorders of the eye. Is the patient up to date with their annual eye exam?  Yes  per pt Who is the provider or what is the name of the office in which the patient attends annual eye exams? [facility] If pt is not established with a provider, would they like to be referred to a provider to establish care? N/A.   Dental Screening: Recommended annual dental exams for proper oral hygiene  Community Resource Referral / Chronic Care Management: CRR required this visit?  No   CCM required this visit?  No      Plan:  - Continue current medication regimen. Provided requested refills. - Stay well hydrated, at least 64 fl oz - Schedule lab visit for FBW in near future (1-4 weeks) - Follow-up in 4 months for regular OV: HLD, glucose intolerance, allergies  I  have personally reviewed  and noted the following in the patient's chart:   . Medical and social history . Use of alcohol, tobacco or illicit drugs  . Current medications and supplements . Functional ability and status . Nutritional status . Physical activity . Advanced directives . List of other physicians . Hospitalizations, surgeries, and ER visits in previous 12 months . Vitals . Screenings to include cognitive, depression, and falls . Referrals and appointments   I discussed the assessment and treatment plan with the patient. The patient was provided an opportunity to ask questions and all were answered. The patient agreed with the plan and demonstrated an understanding of the instructions.  The patient was advised to call back or seek an in-person evaluation if the symptoms worsen or if the condition fails to improve as anticipated.  I provided 16 minutes of non-face-to-face time during this encounter.       01/30/2020   Nurse Notes: none

## 2020-01-26 ENCOUNTER — Telehealth: Payer: Self-pay | Admitting: Physician Assistant

## 2020-01-26 ENCOUNTER — Other Ambulatory Visit: Payer: Self-pay | Admitting: Physician Assistant

## 2020-01-26 DIAGNOSIS — Z0000 Encounter for general adult medical examination without abnormal findings: Secondary | ICD-10-CM

## 2020-01-26 DIAGNOSIS — E785 Hyperlipidemia, unspecified: Secondary | ICD-10-CM

## 2020-01-26 NOTE — Telephone Encounter (Signed)
Please see below. Future labs placed.

## 2020-01-26 NOTE — Telephone Encounter (Signed)
Patient can have labs drawn now. Please schedule and I will place future lab orders. AS< CMA

## 2020-01-26 NOTE — Telephone Encounter (Signed)
Patient called about needing labs, he recently had his MWE and the AVS mentions FBW but did not give a date. He thought this needed to be in the near future but there are no labs pending and the last set of labs that were done were completed last year (01/2019). Please advise when these labs need to be obtained so front staff can schedule appropriately.

## 2020-01-27 ENCOUNTER — Other Ambulatory Visit: Payer: PPO

## 2020-01-27 ENCOUNTER — Other Ambulatory Visit: Payer: Self-pay

## 2020-01-27 DIAGNOSIS — Z0000 Encounter for general adult medical examination without abnormal findings: Secondary | ICD-10-CM | POA: Diagnosis not present

## 2020-01-27 DIAGNOSIS — E785 Hyperlipidemia, unspecified: Secondary | ICD-10-CM

## 2020-01-28 LAB — COMPREHENSIVE METABOLIC PANEL
ALT: 17 IU/L (ref 0–44)
AST: 18 IU/L (ref 0–40)
Albumin/Globulin Ratio: 2.1 (ref 1.2–2.2)
Albumin: 4.4 g/dL (ref 3.7–4.7)
Alkaline Phosphatase: 74 IU/L (ref 48–121)
BUN/Creatinine Ratio: 16 (ref 10–24)
BUN: 14 mg/dL (ref 8–27)
Bilirubin Total: 0.4 mg/dL (ref 0.0–1.2)
CO2: 21 mmol/L (ref 20–29)
Calcium: 9.4 mg/dL (ref 8.6–10.2)
Chloride: 104 mmol/L (ref 96–106)
Creatinine, Ser: 0.86 mg/dL (ref 0.76–1.27)
GFR calc Af Amer: 95 mL/min/{1.73_m2} (ref >59)
GFR calc non Af Amer: 82 mL/min/{1.73_m2} (ref >59)
Globulin, Total: 2.1 g/dL (ref 1.5–4.5)
Glucose: 98 mg/dL (ref 65–99)
Potassium: 4.8 mmol/L (ref 3.5–5.2)
Sodium: 141 mmol/L (ref 134–144)
Total Protein: 6.5 g/dL (ref 6.0–8.5)

## 2020-01-28 LAB — LIPID PANEL
Chol/HDL Ratio: 2.3 ratio (ref 0.0–5.0)
Cholesterol, Total: 215 mg/dL — ABNORMAL HIGH (ref 100–199)
HDL: 95 mg/dL (ref >39)
LDL Chol Calc (NIH): 107 mg/dL — ABNORMAL HIGH (ref 0–99)
Triglycerides: 72 mg/dL (ref 0–149)
VLDL Cholesterol Cal: 13 mg/dL (ref 5–40)

## 2020-01-28 LAB — CBC
Hematocrit: 43.4 % (ref 37.5–51.0)
Hemoglobin: 15.8 g/dL (ref 13.0–17.7)
MCH: 34.1 pg — ABNORMAL HIGH (ref 26.6–33.0)
MCHC: 36.4 g/dL — ABNORMAL HIGH (ref 31.5–35.7)
MCV: 94 fL (ref 79–97)
Platelets: 326 10*3/uL (ref 150–450)
RBC: 4.64 x10E6/uL (ref 4.14–5.80)
RDW: 13 % (ref 11.6–15.4)
WBC: 5.9 10*3/uL (ref 3.4–10.8)

## 2020-01-28 LAB — TSH: TSH: 2.44 u[IU]/mL (ref 0.450–4.500)

## 2020-01-28 LAB — HEMOGLOBIN A1C
Est. average glucose Bld gHb Est-mCnc: 123 mg/dL
Hgb A1c MFr Bld: 5.9 % — ABNORMAL HIGH (ref 4.8–5.6)

## 2020-01-31 MED ORDER — MONTELUKAST SODIUM 10 MG PO TABS: 10.0000 mg | ORAL_TABLET | Freq: Every day | ORAL | 1 refills | Status: DC

## 2020-01-31 MED ORDER — BUDESONIDE-FORMOTEROL FUMARATE 80-4.5 MCG/ACT IN AERO: 2.0000 | INHALATION_SPRAY | Freq: Two times a day (BID) | RESPIRATORY_TRACT | 5 refills | Status: DC

## 2020-02-02 ENCOUNTER — Encounter: Payer: Self-pay | Admitting: Physician Assistant

## 2020-02-02 DIAGNOSIS — D1801 Hemangioma of skin and subcutaneous tissue: Secondary | ICD-10-CM | POA: Diagnosis not present

## 2020-02-02 DIAGNOSIS — L723 Sebaceous cyst: Secondary | ICD-10-CM | POA: Diagnosis not present

## 2020-02-02 DIAGNOSIS — D3617 Benign neoplasm of peripheral nerves and autonomic nervous system of trunk, unspecified: Secondary | ICD-10-CM | POA: Diagnosis not present

## 2020-02-02 DIAGNOSIS — L821 Other seborrheic keratosis: Secondary | ICD-10-CM | POA: Diagnosis not present

## 2020-02-03 ENCOUNTER — Other Ambulatory Visit: Payer: Self-pay

## 2020-02-03 DIAGNOSIS — E538 Deficiency of other specified B group vitamins: Secondary | ICD-10-CM

## 2020-02-03 DIAGNOSIS — E559 Vitamin D deficiency, unspecified: Secondary | ICD-10-CM

## 2020-03-01 ENCOUNTER — Other Ambulatory Visit: Payer: PPO

## 2020-03-01 ENCOUNTER — Other Ambulatory Visit: Payer: Self-pay

## 2020-03-01 DIAGNOSIS — E559 Vitamin D deficiency, unspecified: Secondary | ICD-10-CM | POA: Diagnosis not present

## 2020-03-01 DIAGNOSIS — E538 Deficiency of other specified B group vitamins: Secondary | ICD-10-CM

## 2020-03-02 LAB — VITAMIN B12: Vitamin B-12: 277 pg/mL (ref 232–1245)

## 2020-03-02 LAB — VITAMIN D 25 HYDROXY (VIT D DEFICIENCY, FRACTURES): Vit D, 25-Hydroxy: 46.3 ng/mL (ref 30.0–100.0)

## 2020-03-23 DIAGNOSIS — N139 Obstructive and reflux uropathy, unspecified: Secondary | ICD-10-CM | POA: Diagnosis not present

## 2020-03-23 DIAGNOSIS — R3914 Feeling of incomplete bladder emptying: Secondary | ICD-10-CM | POA: Diagnosis not present

## 2020-03-23 DIAGNOSIS — N400 Enlarged prostate without lower urinary tract symptoms: Secondary | ICD-10-CM | POA: Diagnosis not present

## 2020-05-29 DIAGNOSIS — N139 Obstructive and reflux uropathy, unspecified: Secondary | ICD-10-CM | POA: Diagnosis not present

## 2020-05-29 DIAGNOSIS — R3914 Feeling of incomplete bladder emptying: Secondary | ICD-10-CM | POA: Diagnosis not present

## 2020-05-29 DIAGNOSIS — N400 Enlarged prostate without lower urinary tract symptoms: Secondary | ICD-10-CM | POA: Diagnosis not present

## 2020-08-03 ENCOUNTER — Other Ambulatory Visit: Payer: Self-pay | Admitting: Physician Assistant

## 2020-08-03 DIAGNOSIS — Z9109 Other allergy status, other than to drugs and biological substances: Secondary | ICD-10-CM

## 2020-08-03 DIAGNOSIS — J4540 Moderate persistent asthma, uncomplicated: Secondary | ICD-10-CM

## 2020-08-07 ENCOUNTER — Encounter: Payer: Self-pay | Admitting: Physician Assistant

## 2020-09-29 ENCOUNTER — Telehealth: Payer: Self-pay | Admitting: Physician Assistant

## 2020-09-29 ENCOUNTER — Other Ambulatory Visit: Payer: Self-pay | Admitting: Physician Assistant

## 2020-09-29 DIAGNOSIS — J4540 Moderate persistent asthma, uncomplicated: Secondary | ICD-10-CM

## 2020-09-29 DIAGNOSIS — Z9109 Other allergy status, other than to drugs and biological substances: Secondary | ICD-10-CM

## 2020-09-29 NOTE — Telephone Encounter (Signed)
Please contact patient to schedule per last AVS for further medication refills. AS< CMA

## 2020-10-31 DIAGNOSIS — H40013 Open angle with borderline findings, low risk, bilateral: Secondary | ICD-10-CM | POA: Diagnosis not present

## 2020-11-01 ENCOUNTER — Other Ambulatory Visit: Payer: Self-pay | Admitting: Physician Assistant

## 2020-11-01 DIAGNOSIS — J4540 Moderate persistent asthma, uncomplicated: Secondary | ICD-10-CM

## 2020-11-01 DIAGNOSIS — Z9109 Other allergy status, other than to drugs and biological substances: Secondary | ICD-10-CM

## 2020-11-02 ENCOUNTER — Telehealth: Payer: Self-pay | Admitting: Physician Assistant

## 2020-11-02 NOTE — Telephone Encounter (Signed)
Please contact patient to schedule per last AVS for further med refills. AS, CMA

## 2020-11-06 ENCOUNTER — Encounter: Payer: Self-pay | Admitting: Physician Assistant

## 2020-11-10 ENCOUNTER — Encounter: Payer: Self-pay | Admitting: Physician Assistant

## 2020-11-10 ENCOUNTER — Ambulatory Visit (INDEPENDENT_AMBULATORY_CARE_PROVIDER_SITE_OTHER): Payer: PPO | Admitting: Physician Assistant

## 2020-11-10 ENCOUNTER — Other Ambulatory Visit: Payer: Self-pay

## 2020-11-10 VITALS — BP 121/63 | HR 76 | Temp 97.4°F | Ht 73.0 in | Wt 209.2 lb

## 2020-11-10 DIAGNOSIS — Z9109 Other allergy status, other than to drugs and biological substances: Secondary | ICD-10-CM

## 2020-11-10 DIAGNOSIS — J4540 Moderate persistent asthma, uncomplicated: Secondary | ICD-10-CM | POA: Diagnosis not present

## 2020-11-10 DIAGNOSIS — C439 Malignant melanoma of skin, unspecified: Secondary | ICD-10-CM

## 2020-11-10 MED ORDER — BUDESONIDE-FORMOTEROL FUMARATE 80-4.5 MCG/ACT IN AERO: 2.0000 | INHALATION_SPRAY | Freq: Two times a day (BID) | RESPIRATORY_TRACT | 5 refills | Status: DC

## 2020-11-10 MED ORDER — MONTELUKAST SODIUM 10 MG PO TABS: 10.0000 mg | ORAL_TABLET | Freq: Every day | ORAL | 2 refills | Status: DC

## 2020-11-10 NOTE — Patient Instructions (Signed)
Allergies, Adult An allergy means that your body reacts to something that bothers it (allergen). This can happen from something that you eat, breathe in, or touch. Allergies often affect the nose, eyes, skin, and stomach. They can be mild, moderate, or very bad (severe). An allergy cannot spread from person to person. They can happen at any age. Sometimes, people outgrow them. What are the causes?  Outdoor things, such as pollen, car fumes, and mold.  Indoor things, such as dust, smoke, mold, and pets.  Foods.  Medicines.  Things that bother your skin, such as perfume and bug bites. What increases the risk?  Having family members with allergies or asthma. What are the signs or symptoms? Symptoms depend on how bad your allergy is. Mild to moderate symptoms  Runny nose, stuffy nose, or sneezing.  Itchy mouth, ears, or throat.  A feeling of mucus dripping down the back of your throat.  Sore throat.  Eyes that are itchy, red, watery, or puffy.  A skin rash, or red, swollen areas of skin (hives).  Stomach cramps or bloating. Severe symptoms Very bad allergies to food, medicine, or bug bites may cause a very bad allergy reaction (anaphylaxis). This can be life-threatening. Symptoms include:  A red face.  Wheezing or coughing.  Swollen lips, tongue, or mouth.  Tight or swollen throat.  Chest pain or tightness, or a fast heartbeat.  Trouble breathing or shortness of breath.  Pain in your belly (abdomen), vomiting, or watery poop (diarrhea).  Feeling dizzy or fainting. How is this treated? Treatment for this condition depends on your symptoms. Treatment may include:  Cold, wet cloths for itching and swelling.  Eye drops, nose sprays, or skin creams.  Washing out your nose each day.  A humidifier.  Medicines.  A change to the foods you eat.  Being exposed again and again to tiny amounts of allergens. This helps your body get used to them. You might  have: ? Allergy shots. ? Very small amounts of allergen put under your tongue.  An emergency shot (auto-injector pen) if you have a very bad allergy reaction. ? This is a medicine with a needle. You can put it into your skin by yourself. ? Your doctor will teach you how to use it.      Follow these instructions at home: Medicines  Take or apply over-the-counter and prescription medicines only as told by your doctor.  If you are at risk for a very bad allergy reaction, keep an auto-injector pen with you all the time.   Eating and drinking  Follow instructions from your doctor about what to eat and drink.  Drink enough fluid to keep your pee (urine) pale yellow. General instructions  If you have ever had a very bad allergy reaction, wear a medical alert bracelet or necklace.  Stay away from things that you are allergic to.  Keep all follow-up visits as told by your doctor. This is important. Contact a doctor if:  Your symptoms do not get better with treatment. Get help right away if:  You have symptoms of a very bad allergy reaction. These include: ? A swollen mouth, tongue, or throat. ? Pain or tightness in your chest. ? Trouble breathing. ? Being short of breath. ? Dizziness. ? Fainting. ? Very bad pain in your belly. ? Vomiting. ? Watery poop. These symptoms may be an emergency. Do not wait to see if the symptoms will go away. Get medical help right away. Call your local   emergency services (911 in the [place] Do not drive yourself to the hospital. Summary  Take or apply over-the-counter and prescription medicines only as told by your doctor.  Stay away from things you are allergic to.  If you are at risk for a very bad allergy reaction, carry an auto-injector pen all the time.  Wear a medical alert bracelet or necklace.  Very bad allergy reactions can be life-threatening. Get help right away. This information is not intended to replace advice given to you by your  health care provider. Make sure you discuss any questions you have with your health care provider. Document Revised: 05/19/2019 Document Reviewed: 05/19/2019 Elsevier Patient Education  2021 Elsevier Inc.  

## 2020-11-10 NOTE — Assessment & Plan Note (Signed)
-  Followed by Dermatology, Dr. Ronnald Ramp.

## 2020-11-10 NOTE — Progress Notes (Signed)
Established Patient Office Visit  Subjective:  Patient ID: Jeremy Li, male    DOB: 10-19-1940  Age: 80 y.o. MRN: [id number]  CC:  Chief Complaint  Patient presents with  . Hyperlipidemia  . Allergies    HPI Jeremy Li presents for follow up on RAD and allergies. Patient needs refill on Singulair. Reports medication regimen has kept his allergies and breathing fairly controlled. States if he does not take Singulair can notice a difference with breathing (has wheezing) and allergy symptoms. Takes OTC loratadine. States was on Proventil and was using about every 4-6 hours almost daily and was switched to Symbicort. Denies chest pain, palpitations, shortness of breath, or wheezing. Patinet states has a team of specialists (dermatology, cardiology, urology and ophthalmology) which he follows up routinely or if needed.    Past Medical History:  Diagnosis Date  . Allergy    pets & seasonal allergens    Past Surgical History:  Procedure Laterality Date  . CATARACT EXTRACTION, BILATERAL     Dr Katy Fitch  . DUPUYTREN / PALMAR FASCIOTOMY     Dr Daylene Katayama  . EYE SURGERY N/A    [employer] 01/17/2020  . HERNIA REPAIR    . LUMBAR DISC SURGERY  1989   Dr Cheri Rous  . TONSILLECTOMY      Family History  Problem Relation Age of Onset  . Heart disease Sister        cardiomegaly  . COPD Neg Hx   . Asthma Neg Hx   . Cancer Neg Hx   . Diabetes Neg Hx   . Stroke Neg Hx     Social History   Socioeconomic History  . Marital status: Married    Spouse name: Not on file  . Number of children: 3  . Years of education: 39  . Highest education level: Not on file  Occupational History  . Occupation: Retired  Tobacco Use  . Smoking status: Former Smoker    Packs/day: 4.00    Years: 0.50    Pack years: 2.00    Quit date: 07/22/1961    Years since quitting: 59.3  . Smokeless tobacco: Never Used  . Tobacco comment: smoked pipe 1-2 bowls/ day ages [age]  Vaping Use  . Vaping Use: Never  used  Substance and Sexual Activity  . Alcohol use: Yes    Alcohol/week: 5.0 standard drinks    Types: 5 Standard drinks or equivalent per week  . Drug use: No  . Sexual activity: Yes    Birth control/protection: None  Other Topics Concern  . Not on file  Social History Narrative   Denies abuse and feels safe at home.    Social Determinants of Health   Financial Resource Strain: Not on file  Food Insecurity: Not on file  Transportation Needs: Not on file  Physical Activity: Not on file  Stress: Not on file  Social Connections: Not on file  Intimate Partner Violence: Not on file    Outpatient Medications Prior to Visit  Medication Sig Dispense Refill  . Cholecalciferol (VITAMIN D3) 2000 units capsule Take 1 capsule (2,000 Units total) by mouth daily.    Marland Kitchen loratadine (CLARITIN) 10 MG tablet Take 10 mg by mouth daily.    Marland Kitchen PROAIR HFA 108 (90 Base) MCG/ACT inhaler INHALE 1 TO 2 PUFFS INTO THE LUNGS EVERY 4 HOURS AS NEEDED. 17 g 3  . budesonide-formoterol (SYMBICORT) 80-4.5 MCG/ACT inhaler Inhale 2 puffs into the lungs 2 (two) times daily. 1 Inhaler 5  .  montelukast (SINGULAIR) 10 MG tablet TAKE 1 TABLET (10 MG TOTAL) BY MOUTH AT BEDTIME. **NEEDS APT FOR REFILLS** 15 tablet 0   No facility-administered medications prior to visit.    Allergies  Allergen Reactions  . Dog Epithelium Allergy Skin Test Shortness Of Breath    Other reaction(s): Respiratory distress  . Cat Hair Extract     Other reaction(s): Respiratory Distress  . Flovent Hfa [Fluticasone]     Throat burning and felt swollen  . Other     Other reaction(s): Respiratory Distress Other reaction(s): Respiratory distress    ROS Review of Systems A fourteen system review of systems was performed and found to be positive as per HPI.   Objective:    Physical Exam General:  Well Developed, well nourished, in no acute distress  Neuro:  Alert and oriented,  extra-ocular muscles intact  HEENT:  Normocephalic,  atraumatic, neck supple Skin:  no gross rash, warm, pink. Cardiac:  RRR, S1 S2 wnl's, w/o murmur   Respiratory:  ECTA B/L, Not using accessory muscles, speaking in full sentences- unlabored. Vascular:  Ext warm, no cyanosis apprec.; no gross edema  Psych:  No HI/SI, judgement and insight good, Euthymic mood. Full Affect.   BP 121/63   Pulse 76   Temp (!) 97.4 F (36.3 C)   Ht 6\' 1"  (1.854 m)   Wt 209 lb 3.2 oz (94.9 kg)   SpO2 96%   BMI 27.60 kg/m  Wt Readings from Last 3 Encounters:  11/10/20 209 lb 3.2 oz (94.9 kg)  01/18/20 200 lb (90.7 kg)  07/14/19 197 lb (89.4 kg)     There are no preventive care reminders to display for this patient.  There are no preventive care reminders to display for this patient.  Lab Results  Component Value Date   TSH 2.440 01/27/2020   Lab Results  Component Value Date   WBC 5.9 01/27/2020   HGB 15.8 01/27/2020   HCT 43.4 01/27/2020   MCV 94 01/27/2020   PLT 326 01/27/2020   Lab Results  Component Value Date   NA 141 01/27/2020   K 4.8 01/27/2020   CO2 21 01/27/2020   GLUCOSE 98 01/27/2020   BUN 14 01/27/2020   CREATININE 0.86 01/27/2020   BILITOT 0.4 01/27/2020   ALKPHOS 74 01/27/2020   AST 18 01/27/2020   ALT 17 01/27/2020   PROT 6.5 01/27/2020   ALBUMIN 4.4 01/27/2020   CALCIUM 9.4 01/27/2020   GFR 83.68 07/23/2016   Lab Results  Component Value Date   CHOL 215 (H) 01/27/2020   Lab Results  Component Value Date   HDL 95 01/27/2020   Lab Results  Component Value Date   LDLCALC 107 (H) 01/27/2020   Lab Results  Component Value Date   TRIG 72 01/27/2020   Lab Results  Component Value Date   CHOLHDL 2.3 01/27/2020   Lab Results  Component Value Date   HGBA1C 5.9 (H) 01/27/2020      Assessment & Plan:   Problem List Items Addressed This Visit      Respiratory   Reactive airway disease with wheezing- dogs and cats only - Primary (Chronic)   Relevant Medications   budesonide-formoterol (SYMBICORT)  80-4.5 MCG/ACT inhaler   montelukast (SINGULAIR) 10 MG tablet     Musculoskeletal and Integument   Melanoma of skin (HCC)- nose (Chronic)    -Followed by Dermatology, Dr. Ronnald Ramp.        Other   Environmental allergies   Relevant  Medications   montelukast (SINGULAIR) 10 MG tablet     Reactive airway diease with wheezing, Environmental allergies: -Stable. -Continue current medication regimen. Provided refills. -Will continue to monitor.   Meds ordered this encounter  Medications  . budesonide-formoterol (SYMBICORT) 80-4.5 MCG/ACT inhaler    Sig: Inhale 2 puffs into the lungs 2 (two) times daily.    Dispense:  1 each    Refill:  5    Order Specific Question:   Supervising Provider    Answer:   Beatrice Lecher D [[id number]]  . montelukast (SINGULAIR) 10 MG tablet    Sig: Take 1 tablet (10 mg total) by mouth at bedtime.    Dispense:  90 tablet    Refill:  2    Order Specific Question:   Supervising Provider    Answer:   Beatrice Lecher D [[id number]]    Follow-up: Return in about 3 months (around 02/09/2021) for [facility] and [facility] few days prior .   Note:  This note was prepared with assistance of Dragon voice recognition software. Occasional wrong-word or sound-a-like substitutions may have occurred due to the inherent limitations of voice recognition software.  Lorrene Reid, PA-C

## 2020-12-21 DIAGNOSIS — N400 Enlarged prostate without lower urinary tract symptoms: Secondary | ICD-10-CM | POA: Diagnosis not present

## 2020-12-21 DIAGNOSIS — R3914 Feeling of incomplete bladder emptying: Secondary | ICD-10-CM | POA: Diagnosis not present

## 2021-01-31 DIAGNOSIS — L723 Sebaceous cyst: Secondary | ICD-10-CM | POA: Diagnosis not present

## 2021-01-31 DIAGNOSIS — D1801 Hemangioma of skin and subcutaneous tissue: Secondary | ICD-10-CM | POA: Diagnosis not present

## 2021-01-31 DIAGNOSIS — D3617 Benign neoplasm of peripheral nerves and autonomic nervous system of trunk, unspecified: Secondary | ICD-10-CM | POA: Diagnosis not present

## 2021-01-31 DIAGNOSIS — L821 Other seborrheic keratosis: Secondary | ICD-10-CM | POA: Diagnosis not present

## 2021-02-07 ENCOUNTER — Other Ambulatory Visit: Payer: Self-pay | Admitting: Physician Assistant

## 2021-02-07 DIAGNOSIS — E785 Hyperlipidemia, unspecified: Secondary | ICD-10-CM

## 2021-02-07 DIAGNOSIS — E559 Vitamin D deficiency, unspecified: Secondary | ICD-10-CM

## 2021-02-07 DIAGNOSIS — Z0000 Encounter for general adult medical examination without abnormal findings: Secondary | ICD-10-CM

## 2021-02-07 DIAGNOSIS — E538 Deficiency of other specified B group vitamins: Secondary | ICD-10-CM

## 2021-02-08 ENCOUNTER — Other Ambulatory Visit: Payer: PPO

## 2021-02-08 ENCOUNTER — Other Ambulatory Visit: Payer: Self-pay

## 2021-02-08 DIAGNOSIS — E559 Vitamin D deficiency, unspecified: Secondary | ICD-10-CM | POA: Diagnosis not present

## 2021-02-08 DIAGNOSIS — Z0000 Encounter for general adult medical examination without abnormal findings: Secondary | ICD-10-CM

## 2021-02-08 DIAGNOSIS — E538 Deficiency of other specified B group vitamins: Secondary | ICD-10-CM

## 2021-02-08 DIAGNOSIS — E785 Hyperlipidemia, unspecified: Secondary | ICD-10-CM | POA: Diagnosis not present

## 2021-02-09 LAB — COMPREHENSIVE METABOLIC PANEL
ALT: 19 IU/L (ref 0–44)
AST: 17 IU/L (ref 0–40)
Albumin/Globulin Ratio: 2.2 (ref 1.2–2.2)
Albumin: 4.3 g/dL (ref 3.6–4.6)
Alkaline Phosphatase: 72 IU/L (ref 44–121)
BUN/Creatinine Ratio: 14 (ref 10–24)
BUN: 12 mg/dL (ref 8–27)
Bilirubin Total: 0.5 mg/dL (ref 0.0–1.2)
CO2: 23 mmol/L (ref 20–29)
Calcium: 9.4 mg/dL (ref 8.6–10.2)
Chloride: 102 mmol/L (ref 96–106)
Creatinine, Ser: 0.88 mg/dL (ref 0.76–1.27)
Globulin, Total: 2 g/dL (ref 1.5–4.5)
Glucose: 99 mg/dL (ref 65–99)
Potassium: 4.6 mmol/L (ref 3.5–5.2)
Sodium: 138 mmol/L (ref 134–144)
Total Protein: 6.3 g/dL (ref 6.0–8.5)
eGFR: 86 mL/min/{1.73_m2} (ref >59)

## 2021-02-09 LAB — HEMOGLOBIN A1C
Est. average glucose Bld gHb Est-mCnc: 126 mg/dL
Hgb A1c MFr Bld: 6 % — ABNORMAL HIGH (ref 4.8–5.6)

## 2021-02-09 LAB — LIPID PANEL
Chol/HDL Ratio: 2.8 ratio (ref 0.0–5.0)
Cholesterol, Total: 201 mg/dL — ABNORMAL HIGH (ref 100–199)
HDL: 73 mg/dL (ref >39)
LDL Chol Calc (NIH): 110 mg/dL — ABNORMAL HIGH (ref 0–99)
Triglycerides: 104 mg/dL (ref 0–149)
VLDL Cholesterol Cal: 18 mg/dL (ref 5–40)

## 2021-02-09 LAB — CBC
Hematocrit: 44.3 % (ref 37.5–51.0)
Hemoglobin: 15.2 g/dL (ref 13.0–17.7)
MCH: 32.7 pg (ref 26.6–33.0)
MCHC: 34.3 g/dL (ref 31.5–35.7)
MCV: 95 fL (ref 79–97)
Platelets: 319 10*3/uL (ref 150–450)
RBC: 4.65 x10E6/uL (ref 4.14–5.80)
RDW: 13 % (ref 11.6–15.4)
WBC: 5.4 10*3/uL (ref 3.4–10.8)

## 2021-02-09 LAB — VITAMIN D 25 HYDROXY (VIT D DEFICIENCY, FRACTURES): Vit D, 25-Hydroxy: 43.6 ng/mL (ref 30.0–100.0)

## 2021-02-09 LAB — TSH: TSH: 3.38 u[IU]/mL (ref 0.450–4.500)

## 2021-02-09 LAB — VITAMIN B12: Vitamin B-12: 277 pg/mL (ref 232–1245)

## 2021-02-13 ENCOUNTER — Other Ambulatory Visit: Payer: Self-pay

## 2021-02-13 ENCOUNTER — Encounter: Payer: Self-pay | Admitting: Physician Assistant

## 2021-02-13 ENCOUNTER — Ambulatory Visit (INDEPENDENT_AMBULATORY_CARE_PROVIDER_SITE_OTHER): Payer: PPO | Admitting: Physician Assistant

## 2021-02-13 VITALS — BP 105/68 | HR 64 | Temp 98.3°F | Ht 73.0 in | Wt 208.4 lb

## 2021-02-13 DIAGNOSIS — Z0000 Encounter for general adult medical examination without abnormal findings: Secondary | ICD-10-CM

## 2021-02-13 NOTE — Patient Instructions (Signed)
Preventive Care 65 Years and Older, Male Preventive care refers to lifestyle choices and visits with your health care provider that can promote health and wellness. This includes: A yearly physical exam. This is also called an annual wellness visit. Regular dental and eye exams. Immunizations. Screening for certain conditions. Healthy lifestyle choices, such as: Eating a healthy diet. Getting regular exercise. Not using drugs or products that contain nicotine and tobacco. Limiting alcohol use. What can I expect for my preventive care visit? Physical exam Your health care provider will check your: Height and weight. These may be used to calculate your BMI (body mass index). BMI is a measurement that tells if you are at a healthy weight. Heart rate and blood pressure. Body temperature. Skin for abnormal spots. Counseling Your health care provider may ask you questions about your: Past medical problems. Family's medical history. Alcohol, tobacco, and drug use. Emotional well-being. Home life and relationship well-being. Sexual activity. Diet, exercise, and sleep habits. History of falls. Memory and ability to understand (cognition). Work and work environment. Access to firearms. What immunizations do I need?  Vaccines are usually given at various ages, according to a schedule. Your health care provider will recommend vaccines for you based on your age, medicalhistory, and lifestyle or other factors, such as travel or where you work. What tests do I need? Blood tests Lipid and cholesterol levels. These may be checked every 5 years, or more often depending on your overall health. Hepatitis C test. Hepatitis B test. Screening Lung cancer screening. You may have this screening every year starting at age 55 if you have a 30-pack-year history of smoking and currently smoke or have quit within the past 15 years. Colorectal cancer screening. All adults should have this screening  starting at age 50 and continuing until age 75. Your health care provider may recommend screening at age 45 if you are at increased risk. You will have tests every 1-10 years, depending on your results and the type of screening test. Prostate cancer screening. Recommendations will vary depending on your family history and other risks. Genital exam to check for testicular cancer or hernias. Diabetes screening. This is done by checking your blood sugar (glucose) after you have not eaten for a while (fasting). You may have this done every 1-3 years. Abdominal aortic aneurysm (AAA) screening. You may need this if you are a current or former smoker. STD (sexually transmitted disease) testing, if you are at risk. Follow these instructions at home: Eating and drinking  Eat a diet that includes fresh fruits and vegetables, whole grains, lean protein, and low-fat dairy products. Limit your intake of foods with high amounts of sugar, saturated fats, and salt. Take vitamin and mineral supplements as recommended by your health care provider. Do not drink alcohol if your health care provider tells you not to drink. If you drink alcohol: Limit how much you have to 0-2 drinks a day. Be aware of how much alcohol is in your drink. In the [place] one drink equals one 12 oz bottle of beer (355 mL), one 5 oz glass of wine (148 mL), or one 1 oz glass of hard liquor (44 mL).  Lifestyle Take daily care of your teeth and gums. Brush your teeth every morning and night with fluoride toothpaste. Floss one time each day. Stay active. Exercise for at least 30 minutes 5 or more days each week. Do not use any products that contain nicotine or tobacco, such as cigarettes, e-cigarettes, and chewing tobacco.   If you need help quitting, ask your health care provider. Do not use drugs. If you are sexually active, practice safe sex. Use a condom or other form of protection to prevent STIs (sexually transmitted infections). Talk  with your health care provider about taking a low-dose aspirin or statin. Find healthy ways to cope with stress, such as: Meditation, yoga, or listening to music. Journaling. Talking to a trusted person. Spending time with friends and family. Safety Always wear your seat belt while driving or riding in a vehicle. Do not drive: If you have been drinking alcohol. Do not ride with someone who has been drinking. When you are tired or distracted. While texting. Wear a helmet and other protective equipment during sports activities. If you have firearms in your house, make sure you follow all gun safety procedures. What's next? Visit your health care provider once a year for an annual wellness visit. Ask your health care provider how often you should have your eyes and teeth checked. Stay up to date on all vaccines. This information is not intended to replace advice given to you by your health care provider. Make sure you discuss any questions you have with your healthcare provider. Document Revised: 04/06/2019 Document Reviewed: 07/02/2018 Elsevier Patient Education  2022 [employer] Inc.  

## 2021-02-13 NOTE — Progress Notes (Signed)
Subjective:   Jeremy Li is a 80 y.o. male who presents for Medicare Annual/Subsequent preventive examination.  Review of Systems    General:   No F/C, wt loss Pulm:   No DIB, SOB, pleuritic chest pain Card:  No CP, palpitations Abd:  No n/v/d or pain Ext:  No inc edema from baseline    Objective:    Today's Vitals   02/13/21 1017  BP: 105/68  Pulse: 64  Temp: 98.3 F (36.8 C)  SpO2: 97%  Weight: 208 lb 6.4 oz (94.5 kg)  Height: '6\' 1"'$  (1.854 m)   Body mass index is 27.5 kg/m.  Advanced Directives 08/05/2017  Does Patient Have a Medical Advance Directive? Yes  Type of Advance Directive Living will;Healthcare Power of [place] in Chart? No - copy requested    Current Medications (verified) Outpatient Encounter Medications as of 02/13/2021  Medication Sig   budesonide-formoterol (SYMBICORT) 80-4.5 MCG/ACT inhaler Inhale 2 puffs into the lungs 2 (two) times daily.   Cholecalciferol (VITAMIN D3) 2000 units capsule Take 1 capsule (2,000 Units total) by mouth daily.   loratadine (CLARITIN) 10 MG tablet Take 10 mg by mouth daily.   montelukast (SINGULAIR) 10 MG tablet Take 1 tablet (10 mg total) by mouth at bedtime.   [DISCONTINUED] PROAIR HFA 108 (90 Base) MCG/ACT inhaler INHALE 1 TO 2 PUFFS INTO THE LUNGS EVERY 4 HOURS AS NEEDED.   No facility-administered encounter medications on file as of 02/13/2021.    Allergies (verified) Dog epithelium allergy skin test, Cat hair extract, Flovent hfa [fluticasone], and Other   History: Past Medical History:  Diagnosis Date   Allergy    pets & seasonal allergens   Past Surgical History:  Procedure Laterality Date   CATARACT EXTRACTION, BILATERAL     Dr Katy Fitch   DUPUYTREN / PALMAR FASCIOTOMY     Dr Daylene Katayama   EYE SURGERY N/A    [employer] 01/17/2020   HERNIA REPAIR     LUMBAR DISC SURGERY  1989   Dr Cheri Rous   TONSILLECTOMY     Family History  Problem Relation Age of Onset   Heart  disease Sister        cardiomegaly   COPD Neg Hx    Asthma Neg Hx    Cancer Neg Hx    Diabetes Neg Hx    Stroke Neg Hx    Social History   Socioeconomic History   Marital status: Married    Spouse name: Not on file   Number of children: 3   Years of education: 14   Highest education level: Not on file  Occupational History   Occupation: Retired  Tobacco Use   Smoking status: Former    Packs/day: 4.00    Years: 0.50    Pack years: 2.00    Types: Cigarettes    Quit date: 07/22/1961    Years since quitting: 59.6   Smokeless tobacco: Never   Tobacco comments:    smoked pipe 1-2 bowls/ day ages [age]  Vaping Use   Vaping Use: Never used  Substance and Sexual Activity   Alcohol use: Yes    Alcohol/week: 5.0 standard drinks    Types: 5 Standard drinks or equivalent per week   Drug use: No   Sexual activity: Yes    Birth control/protection: None  Other Topics Concern   Not on file  Social History Narrative   Denies abuse and feels safe at home.  Social Determinants of Health   Financial Resource Strain: Not on file  Food Insecurity: Not on file  Transportation Needs: Not on file  Physical Activity: Not on file  Stress: Not on file  Social Connections: Not on file    Tobacco Counseling Counseling given: Not Answered Tobacco comments: smoked pipe 1-2 bowls/ day ages [age]     Diabetic? no         Activities of Daily Living In your present state of health, do you have any difficulty performing the following activities: 02/13/2021 11/10/2020  Hearing? N N  Vision? N N  Difficulty concentrating or making decisions? N N  Walking or climbing stairs? N N  Dressing or bathing? N N  Doing errands, shopping? N N  Some recent data might be hidden    Patient Care Team: Lorrene Reid, PA-C as PCP - General Daryll Brod, MD as Consulting Physician (Orthopedic Surgery) [place], Peter M, MD as Consulting Physician (Cardiology) Jarome Matin, MD as Consulting  Physician (Dermatology) Festus Aloe, MD as Consulting Physician (Urology) Dyke Maes, OD as Referring Physician (Optometry)  Indicate any recent Medical Services you may have received from other than [facility] providers in the past year (date may be approximate).     Assessment:   This is a routine wellness examination for Jeremy Li.  Hearing/Vision screen No results found.  Dietary issues and exercise activities discussed: -Continue Mediterranean diet and low sugar intake and recommend to stay well hydrated. Continue with walking regimen and staying active with house activities.    Goals Addressed   None   Depression Screen PHQ 2/9 Scores 02/13/2021 11/10/2020 01/18/2020 06/24/2019 01/06/2019 07/07/2018 02/24/2018  PHQ - 2 Score 0 0 0 0 0 0 0  PHQ- 9 Score 0 0 0 0 0 0 0    Fall Risk Fall Risk  02/13/2021 11/10/2020 01/18/2020 07/07/2018 01/06/2018  Falls in the past year? 0 0 1 0 No  Number falls in past yr: 0 - 0 - -  Injury with Fall? 0 - 1 - -  Risk for fall due to : No Fall Risks - History of fall(s) - -  Follow up Falls evaluation completed Falls evaluation completed Falls evaluation completed - -    FALL RISK PREVENTION PERTAINING TO THE HOME:  Any stairs in or around the home? No  If so, are there any without handrails? No  Home free of loose throw rugs in walkways, pet beds, electrical cords, etc? Yes  Adequate lighting in your home to reduce risk of falls? Yes   ASSISTIVE DEVICES UTILIZED TO PREVENT FALLS:  Life alert? No  Use of a cane, walker or w/c? No  Grab bars in the bathroom? Yes  Shower chair or bench in shower? Yes  Elevated toilet seat or a handicapped toilet? Yes   TIMED UP AND GO:  Was the test performed? Yes .  Length of time to ambulate 10 feet: 10 sec.   Gait steady and fast without use of assistive device  Cognitive Function: wnl's     6CIT Screen 02/13/2021 01/18/2020 07/07/2018  What Year? 0 points 0 points 0 points  What month? 0 points 0  points 0 points  What time? 0 points 0 points 0 points  Count back from 20 0 points 0 points 0 points  Months in reverse 0 points 0 points 0 points  Repeat phrase 0 points 0 points 0 points  Total Score 0 0 0    Immunizations Immunization History  Administered Date(s)  Administered   Influenza Whole 05/25/2008, 05/22/2009, 04/21/2012   Influenza, High Dose Seasonal PF 04/21/2014, 05/29/2015, 04/23/2017, 04/29/2018, 05/10/2019   Influenza, Seasonal, Injecte, Preservative Fre 05/07/2013   Influenza,inj,Quad PF,6+ Mos 04/23/2017   Influenza-Unspecified 05/29/2015, 04/23/2017, 05/10/2018   PFIZER(Purple Top)SARS-COV-2 Vaccination 08/11/2019, 09/01/2019, 04/24/2020, 10/26/2020   Pneumococcal Conjugate-13 04/21/2014   Pneumococcal Polysaccharide-23 05/29/2015   Td 09/27/2008   Tdap 01/08/2019   Zoster Recombinat (Shingrix) 06/28/2017, 10/07/2017   Zoster, Live 03/29/2014    TDAP status: Up to date  Flu Vaccine status: Up to date  Pneumococcal vaccine status: Up to date  Covid-19 vaccine status: Completed vaccines  Qualifies for Shingles Vaccine? Yes   Zostavax completed No   Shingrix Completed?: Yes  Screening Tests Health Maintenance  Topic Date Due   INFLUENZA VACCINE  02/19/2021   COVID-19 Vaccine (5 - Booster for Pfizer series) 02/25/2021   TETANUS/TDAP  01/07/2029   PNA vac Low Risk Adult  Completed   Zoster Vaccines- Shingrix  Completed   HPV VACCINES  Aged Out    Health Maintenance  There are no preventive care reminders to display for this patient.  Colorectal cancer screening: No longer required.   Lung Cancer Screening: (Low Dose CT Chest recommended if Age 82-80 years, 30 pack-year currently smoking OR have quit w/in 15years.) does not qualify.   Lung Cancer Screening Referral: n/a  Additional Screening:  Hepatitis C Screening: does qualify; Completed declined  Vision Screening: Recommended annual ophthalmology exams for early detection of glaucoma  and other disorders of the eye. Is the patient up to date with their annual eye exam?  Yes  Who is the provider or what is the name of the office in which the patient attends annual eye exams? [facility] If pt is not established with a provider, would they like to be referred to a provider to establish care? No .   Dental Screening: Recommended annual dental exams for proper oral hygiene  Community Resource Referral / Chronic Care Management: CRR required this visit?  No   CCM required this visit?  No      Plan:  -Continue current medication regimen. -Discussed most recent lab results which are essentially within normal limits or stable from prior. -Follow up in 1 yr for [facility] and [facility] or sooner if needed  I have personally reviewed and noted the following in the patient's chart:   Medical and social history Use of alcohol, tobacco or illicit drugs  Current medications and supplements including opioid prescriptions. Patient is not currently taking opioid prescriptions. Functional ability and status Nutritional status Physical activity Advanced directives List of other physicians Hospitalizations, surgeries, and ER visits in previous 12 months Vitals Screenings to include cognitive, depression, and falls Referrals and appointments  In addition, I have reviewed and discussed with patient certain preventive protocols, quality metrics, and best practice recommendations. A written personalized care plan for preventive services as well as general preventive health recommendations were provided to patient.     Lorrene Reid, PA-C   02/13/2021

## 2021-03-13 ENCOUNTER — Ambulatory Visit (INDEPENDENT_AMBULATORY_CARE_PROVIDER_SITE_OTHER): Payer: PPO | Admitting: Physician Assistant

## 2021-03-13 ENCOUNTER — Other Ambulatory Visit: Payer: Self-pay

## 2021-03-13 ENCOUNTER — Encounter: Payer: Self-pay | Admitting: Physician Assistant

## 2021-03-13 VITALS — BP 130/70 | HR 79 | Temp 98.1°F | Ht 73.0 in | Wt 208.0 lb

## 2021-03-13 DIAGNOSIS — R2242 Localized swelling, mass and lump, left lower limb: Secondary | ICD-10-CM | POA: Diagnosis not present

## 2021-03-13 DIAGNOSIS — M25562 Pain in left knee: Secondary | ICD-10-CM

## 2021-03-13 NOTE — Progress Notes (Signed)
Acute Office Visit  Subjective:    Patient ID: Jeremy Li, male    DOB: 07-30-1940, 80 y.o.   MRN: [id number]  Chief Complaint  Patient presents with   Acute Visit   Left Knee Pain    HPI Patient is in today for left knee pain. Patient states knee is feeling better today. Pain started over the weekend. Saturday could not put weight on his knee and rested for the day. Also has swelling on the side and back side if his knee. Denies injury or fall. States was walking a lot the day before. In the past has seen Orthopedics Percell Miller and [place]) for similar episode and was given Celebrex for two weeks which resolved his symptoms. States has been taking aspirin which has helped.   Past Medical History:  Diagnosis Date   Allergy    pets & seasonal allergens    Past Surgical History:  Procedure Laterality Date   CATARACT EXTRACTION, BILATERAL     Dr Katy Fitch   DUPUYTREN / PALMAR FASCIOTOMY     Dr Daylene Katayama   EYE SURGERY N/A    [employer] 01/17/2020   HERNIA REPAIR     LUMBAR DISC SURGERY  1989   Dr Cheri Rous   TONSILLECTOMY      Family History  Problem Relation Age of Onset   Heart disease Sister        cardiomegaly   COPD Neg Hx    Asthma Neg Hx    Cancer Neg Hx    Diabetes Neg Hx    Stroke Neg Hx     Social History   Socioeconomic History   Marital status: Married    Spouse name: Not on file   Number of children: 3   Years of education: 14   Highest education level: Not on file  Occupational History   Occupation: Retired  Tobacco Use   Smoking status: Former    Packs/day: 4.00    Years: 0.50    Pack years: 2.00    Types: Cigarettes    Quit date: 07/22/1961    Years since quitting: 59.6   Smokeless tobacco: Never   Tobacco comments:    smoked pipe 1-2 bowls/ day ages [age]  Vaping Use   Vaping Use: Never used  Substance and Sexual Activity   Alcohol use: Yes    Alcohol/week: 5.0 standard drinks    Types: 5 Standard drinks or equivalent per week   Drug use: No    Sexual activity: Yes    Birth control/protection: None  Other Topics Concern   Not on file  Social History Narrative   Denies abuse and feels safe at home.    Social Determinants of Health   Financial Resource Strain: Not on file  Food Insecurity: Not on file  Transportation Needs: Not on file  Physical Activity: Not on file  Stress: Not on file  Social Connections: Not on file  Intimate Partner Violence: Not on file    Outpatient Medications Prior to Visit  Medication Sig Dispense Refill   budesonide-formoterol (SYMBICORT) 80-4.5 MCG/ACT inhaler Inhale 2 puffs into the lungs 2 (two) times daily. 1 each 5   Cholecalciferol (VITAMIN D3) 2000 units capsule Take 1 capsule (2,000 Units total) by mouth daily.     loratadine (CLARITIN) 10 MG tablet Take 10 mg by mouth daily.     montelukast (SINGULAIR) 10 MG tablet Take 1 tablet (10 mg total) by mouth at bedtime. 90 tablet 2   No facility-administered medications  prior to visit.    Allergies  Allergen Reactions   Dog Epithelium Allergy Skin Test Shortness Of Breath    Other reaction(s): Respiratory distress   Cat Hair Extract     Other reaction(s): Respiratory Distress   Flovent Hfa [Fluticasone]     Throat burning and felt swollen   Other     Other reaction(s): Respiratory Distress Other reaction(s): Respiratory distress    Review of Systems Review of Systems:  A fourteen system review of systems was performed and found to be positive as per HPI.   Objective:    Physical Exam General:  Well Developed, well nourished, appropriate for stated age.  Neuro:  Alert and oriented,  extra-ocular muscles intact  HEENT:  Normocephalic, atraumatic, neck supple, no carotid bruits appreciated  Skin:  no gross rash, warm, pink. Respiratory:  Not using accessory muscles, speaking in full sentences- unlabored. MSK: No deformity, tenderness of patellar ligament, no joint line tenderness, no effusion, mass is palpated posterior of  left knee, overall good ambulation with some discomfort  Vascular:  Ext warm, no cyanosis apprec.; cap RF less 2 sec. Psych:  No HI/SI, judgement and insight good, Euthymic mood. Full Affect.   BP 130/70   Pulse 79   Temp 98.1 F (36.7 C)   Ht 6' 1"  (1.854 m)   Wt 208 lb (94.3 kg)   SpO2 99%   BMI 27.44 kg/m  Wt Readings from Last 3 Encounters:  03/13/21 208 lb (94.3 kg)  02/13/21 208 lb 6.4 oz (94.5 kg)  11/10/20 209 lb 3.2 oz (94.9 kg)    Health Maintenance Due  Topic Date Due   COVID-19 Vaccine (5 - Booster for Pfizer series) 02/25/2021   INFLUENZA VACCINE  02/19/2021    There are no preventive care reminders to display for this patient.   Lab Results  Component Value Date   TSH 3.380 02/08/2021   Lab Results  Component Value Date   WBC 5.4 02/08/2021   HGB 15.2 02/08/2021   HCT 44.3 02/08/2021   MCV 95 02/08/2021   PLT 319 02/08/2021   Lab Results  Component Value Date   NA 138 02/08/2021   K 4.6 02/08/2021   CO2 23 02/08/2021   GLUCOSE 99 02/08/2021   BUN 12 02/08/2021   CREATININE 0.88 02/08/2021   BILITOT 0.5 02/08/2021   ALKPHOS 72 02/08/2021   AST 17 02/08/2021   ALT 19 02/08/2021   PROT 6.3 02/08/2021   ALBUMIN 4.3 02/08/2021   CALCIUM 9.4 02/08/2021   EGFR 86 02/08/2021   GFR 83.68 07/23/2016   Lab Results  Component Value Date   CHOL 201 (H) 02/08/2021   Lab Results  Component Value Date   HDL 73 02/08/2021   Lab Results  Component Value Date   LDLCALC 110 (H) 02/08/2021   Lab Results  Component Value Date   TRIG 104 02/08/2021   Lab Results  Component Value Date   CHOLHDL 2.8 02/08/2021   Lab Results  Component Value Date   HGBA1C 6.0 (H) 02/08/2021       Assessment & Plan:   Problem List Items Addressed This Visit   None Visit Diagnoses     Mass of left knee    -  Primary   Relevant Orders   VAS [place] LOWER EXTREMITY VENOUS (DVT)   Acute pain of left knee          Acute pain of left knee: -Discussed with  patient likely exacerbation of arthritis/patellofemoral  pain syndrome secondary to walking. Recommend to continue anti-inflammatory, pt declined rx for Celebrex. Recommend RICE therapy.  Mass of left knee: -Suspicious for Delauter's cyst, will place order for ultrasound for further evaluation.  No orders of the defined types were placed in this encounter.    Lorrene Reid, PA-C

## 2021-03-21 ENCOUNTER — Ambulatory Visit (INDEPENDENT_AMBULATORY_CARE_PROVIDER_SITE_OTHER): Payer: PPO

## 2021-03-21 ENCOUNTER — Other Ambulatory Visit: Payer: Self-pay

## 2021-03-21 DIAGNOSIS — R2242 Localized swelling, mass and lump, left lower limb: Secondary | ICD-10-CM | POA: Diagnosis not present

## 2021-03-29 ENCOUNTER — Encounter: Payer: Self-pay | Admitting: Physician Assistant

## 2021-04-04 ENCOUNTER — Other Ambulatory Visit: Payer: Self-pay

## 2021-04-04 DIAGNOSIS — R2242 Localized swelling, mass and lump, left lower limb: Secondary | ICD-10-CM

## 2021-04-04 DIAGNOSIS — M25562 Pain in left knee: Secondary | ICD-10-CM

## 2021-04-11 ENCOUNTER — Other Ambulatory Visit: Payer: Self-pay

## 2021-04-11 ENCOUNTER — Ambulatory Visit (HOSPITAL_BASED_OUTPATIENT_CLINIC_OR_DEPARTMENT_OTHER)
Admission: RE | Admit: 2021-04-11 | Discharge: 2021-04-11 | Disposition: A | Discharge status: Home / Self Care | Payer: PPO | Source: Ambulatory Visit | Attending: Physician Assistant | Admitting: Physician Assistant

## 2021-04-11 DIAGNOSIS — R2242 Localized swelling, mass and lump, left lower limb: Secondary | ICD-10-CM | POA: Diagnosis not present

## 2021-04-11 DIAGNOSIS — M25562 Pain in left knee: Secondary | ICD-10-CM | POA: Insufficient documentation

## 2021-08-13 ENCOUNTER — Other Ambulatory Visit: Payer: Self-pay | Admitting: Physician Assistant

## 2021-08-13 DIAGNOSIS — Z9109 Other allergy status, other than to drugs and biological substances: Secondary | ICD-10-CM

## 2021-08-13 DIAGNOSIS — J4540 Moderate persistent asthma, uncomplicated: Secondary | ICD-10-CM

## 2021-10-17 ENCOUNTER — Encounter: Payer: Self-pay | Admitting: Physician Assistant

## 2021-11-06 DIAGNOSIS — H40013 Open angle with borderline findings, low risk, bilateral: Secondary | ICD-10-CM | POA: Diagnosis not present

## 2021-11-19 ENCOUNTER — Other Ambulatory Visit: Payer: Self-pay | Admitting: Physician Assistant

## 2021-11-19 ENCOUNTER — Encounter: Payer: Self-pay | Admitting: Physician Assistant

## 2021-11-19 DIAGNOSIS — J4540 Moderate persistent asthma, uncomplicated: Secondary | ICD-10-CM

## 2021-11-19 DIAGNOSIS — Z9109 Other allergy status, other than to drugs and biological substances: Secondary | ICD-10-CM

## 2021-11-20 ENCOUNTER — Encounter: Payer: Self-pay | Admitting: Physician Assistant

## 2021-11-21 ENCOUNTER — Other Ambulatory Visit: Payer: Self-pay | Admitting: Physician Assistant

## 2021-11-21 ENCOUNTER — Encounter: Payer: Self-pay | Admitting: Physician Assistant

## 2021-11-21 DIAGNOSIS — J4540 Moderate persistent asthma, uncomplicated: Secondary | ICD-10-CM

## 2021-11-22 MED ORDER — FLUTICASONE-SALMETEROL 100-50 MCG/ACT IN AEPB: 1.0000 | INHALATION_SPRAY | Freq: Two times a day (BID) | RESPIRATORY_TRACT | 3 refills | Status: DC

## 2022-02-01 ENCOUNTER — Other Ambulatory Visit: Payer: Self-pay | Admitting: Physician Assistant

## 2022-02-01 DIAGNOSIS — Z0000 Encounter for general adult medical examination without abnormal findings: Secondary | ICD-10-CM

## 2022-02-01 DIAGNOSIS — E785 Hyperlipidemia, unspecified: Secondary | ICD-10-CM

## 2022-02-06 DIAGNOSIS — B078 Other viral warts: Secondary | ICD-10-CM | POA: Diagnosis not present

## 2022-02-06 DIAGNOSIS — D1801 Hemangioma of skin and subcutaneous tissue: Secondary | ICD-10-CM | POA: Diagnosis not present

## 2022-02-06 DIAGNOSIS — L723 Sebaceous cyst: Secondary | ICD-10-CM | POA: Diagnosis not present

## 2022-02-06 DIAGNOSIS — L821 Other seborrheic keratosis: Secondary | ICD-10-CM | POA: Diagnosis not present

## 2022-02-06 DIAGNOSIS — D3617 Benign neoplasm of peripheral nerves and autonomic nervous system of trunk, unspecified: Secondary | ICD-10-CM | POA: Diagnosis not present

## 2022-02-08 ENCOUNTER — Other Ambulatory Visit: Payer: PPO

## 2022-02-08 DIAGNOSIS — E785 Hyperlipidemia, unspecified: Secondary | ICD-10-CM

## 2022-02-08 DIAGNOSIS — Z0000 Encounter for general adult medical examination without abnormal findings: Secondary | ICD-10-CM | POA: Diagnosis not present

## 2022-02-09 LAB — HEMOGLOBIN A1C
Est. average glucose Bld gHb Est-mCnc: 126 mg/dL
Hgb A1c MFr Bld: 6 % — ABNORMAL HIGH (ref 4.8–5.6)

## 2022-02-09 LAB — LIPID PANEL
Chol/HDL Ratio: 2.3 ratio (ref 0.0–5.0)
Cholesterol, Total: 190 mg/dL (ref 100–199)
HDL: 84 mg/dL (ref >39)
LDL Chol Calc (NIH): 94 mg/dL (ref 0–99)
Triglycerides: 67 mg/dL (ref 0–149)
VLDL Cholesterol Cal: 12 mg/dL (ref 5–40)

## 2022-02-09 LAB — CBC
Hematocrit: 44.2 % (ref 37.5–51.0)
Hemoglobin: 15.1 g/dL (ref 13.0–17.7)
MCH: 32.6 pg (ref 26.6–33.0)
MCHC: 34.2 g/dL (ref 31.5–35.7)
MCV: 96 fL (ref 79–97)
Platelets: 288 10*3/uL (ref 150–450)
RBC: 4.63 x10E6/uL (ref 4.14–5.80)
RDW: 13.3 % (ref 11.6–15.4)
WBC: 5.2 10*3/uL (ref 3.4–10.8)

## 2022-02-09 LAB — COMPREHENSIVE METABOLIC PANEL
ALT: 17 IU/L (ref 0–44)
AST: 16 IU/L (ref 0–40)
Albumin/Globulin Ratio: 1.8 (ref 1.2–2.2)
Albumin: 4 g/dL (ref 3.7–4.7)
Alkaline Phosphatase: 66 IU/L (ref 44–121)
BUN/Creatinine Ratio: 14 (ref 10–24)
BUN: 13 mg/dL (ref 8–27)
Bilirubin Total: 0.4 mg/dL (ref 0.0–1.2)
CO2: 23 mmol/L (ref 20–29)
Calcium: 8.9 mg/dL (ref 8.6–10.2)
Chloride: 105 mmol/L (ref 96–106)
Creatinine, Ser: 0.93 mg/dL (ref 0.76–1.27)
Globulin, Total: 2.2 g/dL (ref 1.5–4.5)
Glucose: 99 mg/dL (ref 70–99)
Potassium: 4.6 mmol/L (ref 3.5–5.2)
Sodium: 141 mmol/L (ref 134–144)
Total Protein: 6.2 g/dL (ref 6.0–8.5)
eGFR: 82 mL/min/{1.73_m2} (ref >59)

## 2022-02-09 LAB — TSH: TSH: 2.45 u[IU]/mL (ref 0.450–4.500)

## 2022-02-13 ENCOUNTER — Ambulatory Visit (INDEPENDENT_AMBULATORY_CARE_PROVIDER_SITE_OTHER): Payer: PPO | Admitting: Physician Assistant

## 2022-02-13 ENCOUNTER — Encounter: Payer: Self-pay | Admitting: Physician Assistant

## 2022-02-13 VITALS — BP 126/70 | HR 65 | Ht 72.84 in | Wt 208.1 lb

## 2022-02-13 DIAGNOSIS — Z9109 Other allergy status, other than to drugs and biological substances: Secondary | ICD-10-CM

## 2022-02-13 DIAGNOSIS — Z0000 Encounter for general adult medical examination without abnormal findings: Secondary | ICD-10-CM

## 2022-02-13 DIAGNOSIS — J4540 Moderate persistent asthma, uncomplicated: Secondary | ICD-10-CM

## 2022-02-13 MED ORDER — BUDESONIDE-FORMOTEROL FUMARATE 80-4.5 MCG/ACT IN AERO: 2.0000 | INHALATION_SPRAY | Freq: Two times a day (BID) | RESPIRATORY_TRACT | 3 refills | Status: DC

## 2022-02-13 MED ORDER — MONTELUKAST SODIUM 10 MG PO TABS: 10.0000 mg | ORAL_TABLET | Freq: Every day | ORAL | 1 refills | Status: DC

## 2022-02-13 NOTE — Progress Notes (Signed)
Subjective:   Jeremy Li is a 81 y.o. male who presents for an Initial Medicare Annual Wellness Visit.  Review of Systems    General:   No F/C, wt loss Pulm:   No DIB, SOB, pleuritic chest pain Card:  No CP, palpitations Abd:  No n/v/d or pain Ext:  No inc edema from baseline      Objective:    Today's Vitals   02/13/22 1006 02/13/22 1024  BP: (!) 149/70 126/70  Pulse: 65   SpO2: 95%   Weight: 208 lb 1.9 oz (94.4 kg)   Height: 6' 0.84" (1.85 m)    Body mass index is 27.58 kg/m.     08/05/2017   11:09 AM  Advanced Directives  Does Patient Have a Medical Advance Directive? Yes  Type of Advance Directive Living will;Healthcare Power of [place] in Chart? No - copy requested    Current Medications (verified) Outpatient Encounter Medications as of 02/13/2022  Medication Sig   Cholecalciferol (VITAMIN D3) 2000 units capsule Take 1 capsule (2,000 Units total) by mouth daily.   loratadine (CLARITIN) 10 MG tablet Take 10 mg by mouth daily.   [DISCONTINUED] budesonide-formoterol (SYMBICORT) 80-4.5 MCG/ACT inhaler Inhale 2 puffs into the lungs 2 (two) times daily.   [DISCONTINUED] montelukast (SINGULAIR) 10 MG tablet TAKE 1 TABLET (10 MG TOTAL) BY MOUTH AT BEDTIME.   budesonide-formoterol (SYMBICORT) 80-4.5 MCG/ACT inhaler Inhale 2 puffs into the lungs 2 (two) times daily.   montelukast (SINGULAIR) 10 MG tablet Take 1 tablet (10 mg total) by mouth at bedtime.   [DISCONTINUED] fluticasone-salmeterol (ADVAIR) 100-50 MCG/ACT AEPB Inhale 1 puff into the lungs 2 (two) times daily. (Patient not taking: Reported on 02/13/2022)   No facility-administered encounter medications on file as of 02/13/2022.    Allergies (verified) Dog epithelium allergy skin test, Cat hair extract, Flovent hfa [fluticasone], and Other   History: Past Medical History:  Diagnosis Date   Allergy    pets & seasonal allergens   Past Surgical History:  Procedure  Laterality Date   CATARACT EXTRACTION, BILATERAL     Dr Katy Fitch   DUPUYTREN / PALMAR FASCIOTOMY     Dr Daylene Katayama   EYE SURGERY N/A    [employer] 01/17/2020   HERNIA REPAIR     LUMBAR DISC SURGERY  1989   Dr Cheri Rous   TONSILLECTOMY     Family History  Problem Relation Age of Onset   Heart disease Sister        cardiomegaly   COPD Neg Hx    Asthma Neg Hx    Cancer Neg Hx    Diabetes Neg Hx    Stroke Neg Hx    Social History   Socioeconomic History   Marital status: Married    Spouse name: Not on file   Number of children: 3   Years of education: 14   Highest education level: Not on file  Occupational History   Occupation: Retired  Tobacco Use   Smoking status: Former    Packs/day: 4.00    Years: 0.50    Total pack years: 2.00    Types: Cigarettes    Quit date: 07/22/1961    Years since quitting: 60.6   Smokeless tobacco: Never   Tobacco comments:    smoked pipe 1-2 bowls/ day ages [age]  Vaping Use   Vaping Use: Never used  Substance and Sexual Activity   Alcohol use: Yes    Alcohol/week: 5.0 standard  drinks of alcohol    Types: 5 Standard drinks or equivalent per week   Drug use: No   Sexual activity: Yes    Birth control/protection: None  Other Topics Concern   Not on file  Social History Narrative   Denies abuse and feels safe at home.    Social Determinants of Health   Financial Resource Strain: Not on file  Food Insecurity: Not on file  Transportation Needs: Not on file  Physical Activity: Not on file  Stress: Not on file  Social Connections: Not on file    Tobacco Counseling Counseling given: Not Answered Tobacco comments: smoked pipe 1-2 bowls/ day ages [age]   Diabetic?no         Activities of Daily Living    03/13/2021    3:15 PM  In your present state of health, do you have any difficulty performing the following activities:  Hearing? 0  Vision? 0  Difficulty concentrating or making decisions? 0  Walking or climbing stairs? 0   Dressing or bathing? 0  Doing errands, shopping? 0    Patient Care Team: Lorrene Reid, PA-C as PCP - General Daryll Brod, MD as Consulting Physician (Orthopedic Surgery) Martinique, Peter M, MD as Consulting Physician (Cardiology) Jarome Matin, MD as Consulting Physician (Dermatology) Festus Aloe, MD as Consulting Physician (Urology) Dyke Maes, OD as Referring Physician (Optometry)  Indicate any recent Medical Services you may have received from other than [facility] providers in the past year (date may be approximate).     Assessment:   This is a routine wellness examination for Jeremy Li.  Hearing/Vision screen No results found.  Dietary issues and exercise activities discussed:  -[place] and stays active around the house and with errands.   Goals Addressed   None   Depression Screen    02/13/2022   10:14 AM 03/13/2021    3:15 PM 02/13/2021   10:18 AM 11/10/2020    9:17 AM 01/18/2020    1:10 PM 06/24/2019    9:09 AM 01/06/2019    1:22 PM  PHQ 2/9 Scores  PHQ - 2 Score 0 0 0 0 0 0 0  PHQ- 9 Score 0 0 0 0 0 0 0    Fall Risk    02/13/2022   10:14 AM 03/13/2021    3:15 PM 02/13/2021   10:18 AM 11/10/2020    9:17 AM 01/18/2020    1:09 PM  [place] in the past year? 0 1 0 0 1  Number falls in past yr: 0 0 0  0  Injury with Fall? 0 0 0  1  Risk for fall due to :  Impaired mobility No Fall Risks  History of fall(s)  Follow up Falls evaluation completed Falls evaluation completed Falls evaluation completed Falls evaluation completed Falls evaluation completed    [place]:  Any stairs in or around the home? Yes  If so, are there any without handrails? No  Home free of loose throw rugs in walkways, pet beds, electrical cords, etc? Yes  Adequate lighting in your home to reduce risk of falls? Yes   ASSISTIVE DEVICES UTILIZED TO PREVENT FALLS:  Life alert? No  Use of a cane, walker or w/c? No  Grab bars in  the bathroom? Yes  Shower chair or bench in shower? No  Elevated toilet seat or a handicapped toilet? Yes   TIMED UP AND GO:  Was the test performed? Yes .  Length of time to ambulate 10 feet: 10 sec.   Gait steady and fast without use of assistive device  Cognitive Function:        02/13/2022   10:15 AM 02/13/2021   10:05 AM 01/18/2020    1:13 PM 07/07/2018    1:29 PM  6CIT Screen  What Year? 0 points 0 points 0 points 0 points  What month?  0 points 0 points 0 points  What time? 0 points 0 points 0 points 0 points  Count back from 20 0 points 0 points 0 points 0 points  Months in reverse 0 points 0 points 0 points 0 points  Repeat phrase 0 points 0 points 0 points 0 points  Total Score  0 points 0 points 0 points    Immunizations Immunization History  Administered Date(s) Administered   Influenza Whole 05/25/2008, 05/22/2009, 04/21/2012   Influenza, High Dose Seasonal PF 04/21/2014, 05/29/2015, 04/23/2017, 04/29/2018, 05/10/2019   Influenza, Seasonal, Injecte, Preservative Fre 05/07/2013   Influenza,inj,Quad PF,6+ Mos 04/23/2017   Influenza-Unspecified 05/29/2015, 04/23/2017, 05/10/2018   PFIZER(Purple Top)SARS-COV-2 Vaccination 08/11/2019, 09/01/2019, 04/24/2020, 10/26/2020   Pneumococcal Conjugate-13 04/21/2014   Pneumococcal Polysaccharide-23 05/29/2015   Td 09/27/2008   Tdap 01/08/2019   Zoster Recombinat (Shingrix) 06/28/2017, 10/07/2017   Zoster, Live 03/29/2014    TDAP status: Up to date  Flu Vaccine status: Due, Education has been provided regarding the importance of this vaccine. Advised may receive this vaccine at local pharmacy or Health Dept. Aware to provide a copy of the vaccination record if obtained from local pharmacy or Health Dept. Verbalized acceptance and understanding.  Pneumococcal vaccine status: Due, Education has been provided regarding the importance of this vaccine. Advised may receive this vaccine at local pharmacy or Health Dept. Aware  to provide a copy of the vaccination record if obtained from local pharmacy or Health Dept. Verbalized acceptance and understanding.  Covid-19 vaccine status: Completed vaccines  Qualifies for Shingles Vaccine? Yes   Zostavax completed Yes   Shingrix Completed?: Yes  Screening Tests Health Maintenance  Topic Date Due   COVID-19 Vaccine (5 - Booster for Pfizer series) 12/21/2020   INFLUENZA VACCINE  02/19/2022   TETANUS/TDAP  01/07/2029   Pneumonia Vaccine 18+ Years old  Completed   Zoster Vaccines- Shingrix  Completed   HPV VACCINES  Aged Out    Health Maintenance  Health Maintenance Due  Topic Date Due   COVID-19 Vaccine (5 - Booster for Ventress series) 12/21/2020    Colorectal cancer screening: No longer required.   Lung Cancer Screening: (Low Dose CT Chest recommended if Age 80-80 years, 30 pack-year currently smoking OR have quit w/in 15years.) does not qualify.   Lung Cancer Screening Referral: n/a  Additional Screening:  Hepatitis C Screening: does not qualify; Completed n/a  Vision Screening: Recommended annual ophthalmology exams for early detection of glaucoma and other disorders of the eye. Is the patient up to date with their annual eye exam?  Yes  Who is the provider or what is the name of the office in which the patient attends annual eye exams? [facility] If pt is not established with a provider, would they like to be referred to a provider to establish care? No .   Dental Screening: Recommended annual dental exams for proper oral hygiene  Community Resource Referral / Chronic Care Management: CRR required this visit?  No   CCM required this visit?  No      Plan:  -Provided medication refills. -Patient  is aware of lab results. -Recommend to follow-up in 6 months for HLD, asthma  I have personally reviewed and noted the following in the patient's chart:   Medical and social history Use of alcohol, tobacco or illicit drugs  Current medications  and supplements including opioid prescriptions. Patient is not currently taking opioid prescriptions. Functional ability and status Nutritional status Physical activity Advanced directives List of other physicians Hospitalizations, surgeries, and ER visits in previous 12 months Vitals Screenings to include cognitive, depression, and falls Referrals and appointments  In addition, I have reviewed and discussed with patient certain preventive protocols, quality metrics, and best practice recommendations. A written personalized care plan for preventive services as well as general preventive health recommendations were provided to patient.        02/13/2022

## 2022-02-22 DIAGNOSIS — N139 Obstructive and reflux uropathy, unspecified: Secondary | ICD-10-CM | POA: Diagnosis not present

## 2022-02-22 DIAGNOSIS — R972 Elevated prostate specific antigen [PSA]: Secondary | ICD-10-CM | POA: Diagnosis not present

## 2022-02-22 DIAGNOSIS — R3914 Feeling of incomplete bladder emptying: Secondary | ICD-10-CM | POA: Diagnosis not present

## 2022-02-22 DIAGNOSIS — N400 Enlarged prostate without lower urinary tract symptoms: Secondary | ICD-10-CM | POA: Diagnosis not present

## 2022-04-05 ENCOUNTER — Encounter: Payer: Self-pay | Admitting: Physician Assistant

## 2022-05-20 ENCOUNTER — Encounter (INDEPENDENT_AMBULATORY_CARE_PROVIDER_SITE_OTHER): Payer: Self-pay

## 2022-08-09 ENCOUNTER — Emergency Department (HOSPITAL_COMMUNITY): Payer: PPO

## 2022-08-09 ENCOUNTER — Encounter (HOSPITAL_COMMUNITY): Payer: Self-pay | Admitting: Emergency Medicine

## 2022-08-09 ENCOUNTER — Ambulatory Visit
Admission: RE | Admit: 2022-08-09 | Discharge: 2022-08-09 | Disposition: A | Discharge status: Home / Self Care | Payer: PPO | Source: Ambulatory Visit | Attending: Physician Assistant | Admitting: Physician Assistant

## 2022-08-09 ENCOUNTER — Emergency Department (HOSPITAL_COMMUNITY)
Admission: EM | Admit: 2022-08-09 | Discharge: 2022-08-09 | Disposition: A | Discharge status: Home / Self Care | Payer: PPO | Attending: Emergency Medicine | Admitting: Emergency Medicine

## 2022-08-09 VITALS — BP 158/78 | HR 60 | Temp 98.6°F | Resp 18

## 2022-08-09 DIAGNOSIS — W44F3XA Food entering into or through a natural orifice, initial encounter: Secondary | ICD-10-CM

## 2022-08-09 DIAGNOSIS — T18128A Food in esophagus causing other injury, initial encounter: Secondary | ICD-10-CM | POA: Diagnosis not present

## 2022-08-09 DIAGNOSIS — X58XXXA Exposure to other specified factors, initial encounter: Secondary | ICD-10-CM | POA: Diagnosis not present

## 2022-08-09 DIAGNOSIS — M47814 Spondylosis without myelopathy or radiculopathy, thoracic region: Secondary | ICD-10-CM | POA: Diagnosis not present

## 2022-08-09 DIAGNOSIS — R1111 Vomiting without nausea: Secondary | ICD-10-CM

## 2022-08-09 LAB — COMPREHENSIVE METABOLIC PANEL
ALT: 20 U/L (ref 0–44)
AST: 21 U/L (ref 15–41)
Albumin: 4.7 g/dL (ref 3.5–5.0)
Alkaline Phosphatase: 53 U/L (ref 38–126)
Anion gap: 10 (ref 5–15)
BUN: 22 mg/dL (ref 8–23)
CO2: 25 mmol/L (ref 22–32)
Calcium: 9.2 mg/dL (ref 8.9–10.3)
Chloride: 109 mmol/L (ref 98–111)
Creatinine, Ser: 0.95 mg/dL (ref 0.61–1.24)
GFR, Estimated: >60 mL/min (ref >60)
Glucose, Bld: 113 mg/dL — ABNORMAL HIGH (ref 70–99)
Potassium: 3.8 mmol/L (ref 3.5–5.1)
Sodium: 144 mmol/L (ref 135–145)
Total Bilirubin: 0.8 mg/dL (ref 0.3–1.2)
Total Protein: 7.4 g/dL (ref 6.5–8.1)

## 2022-08-09 LAB — CBC
HCT: 47.7 % (ref 39.0–52.0)
Hemoglobin: 15.6 g/dL (ref 13.0–17.0)
MCH: 33.3 pg (ref 26.0–34.0)
MCHC: 32.7 g/dL (ref 30.0–36.0)
MCV: 101.9 fL — ABNORMAL HIGH (ref 80.0–100.0)
Platelets: 301 10*3/uL (ref 150–400)
RBC: 4.68 MIL/uL (ref 4.22–5.81)
RDW: 13.2 % (ref 11.5–15.5)
WBC: 7.7 10*3/uL (ref 4.0–10.5)
nRBC: 0 % (ref 0.0–0.2)

## 2022-08-09 LAB — LIPASE, BLOOD: Lipase: 30 U/L (ref 11–51)

## 2022-08-09 MED ORDER — DIPHENHYDRAMINE HCL 50 MG/ML IJ SOLN
12.5000 mg | Freq: Once | INTRAMUSCULAR | Status: AC
  Administered 2022-08-09: 12.5 mg via INTRAVENOUS
  Filled 2022-08-09: qty 1

## 2022-08-09 MED ORDER — GLUCAGON HCL RDNA (DIAGNOSTIC) 1 MG IJ SOLR
1.0000 mg | Freq: Once | INTRAMUSCULAR | Status: AC
  Administered 2022-08-09: 1 mg via INTRAVENOUS
  Filled 2022-08-09: qty 1

## 2022-08-09 MED ORDER — ESOMEPRAZOLE MAGNESIUM 40 MG PO CPDR: 40.0000 mg | DELAYED_RELEASE_CAPSULE | Freq: Every day | ORAL | 0 refills | Status: DC

## 2022-08-09 MED ORDER — FAMOTIDINE IN NACL 20-0.9 MG/50ML-% IV SOLN
20.0000 mg | Freq: Once | INTRAVENOUS | Status: AC
  Administered 2022-08-09: 20 mg via INTRAVENOUS
  Filled 2022-08-09: qty 50

## 2022-08-09 MED ORDER — METOCLOPRAMIDE HCL 5 MG/ML IJ SOLN
10.0000 mg | Freq: Once | INTRAMUSCULAR | Status: AC
  Administered 2022-08-09: 10 mg via INTRAVENOUS
  Filled 2022-08-09: qty 2

## 2022-08-09 NOTE — ED Provider Notes (Signed)
[facility] Provider Note   CSN: [id number] Arrival date & time: 08/09/22  1607     History  Chief Complaint  Patient presents with   Gastroesophageal Reflux    Jeremy Li is a 82 y.o. male history of reflux here presenting with trouble swallowing.  Patient states that occasionally he noticed more frequent trouble swallowing especially at nighttime.  He states that his last meal was yesterday for breakfast and he had some sausage and eggs.  He states that since then he is unable to eat.  Today he tried to go out for lunch and drink some Pepsi and threw it back up.  He states that he is able to tolerate his own saliva.  No known esophageal strictures.  The history is provided by the patient.       Home Medications Prior to Admission medications   Medication Sig Start Date End Date Taking? Authorizing Provider  budesonide-formoterol (SYMBICORT) 80-4.5 MCG/ACT inhaler Inhale 2 puffs into the lungs 2 (two) times daily. 02/13/22   Lorrene Reid, PA-C  Cholecalciferol (VITAMIN D3) 2000 units capsule Take 1 capsule (2,000 Units total) by mouth daily. 08/29/17   Opalski, Neoma Laming, DO  loratadine (CLARITIN) 10 MG tablet Take 10 mg by mouth daily.    [provider]  montelukast (SINGULAIR) 10 MG tablet Take 1 tablet (10 mg total) by mouth at bedtime. 02/13/22   Lorrene Reid, PA-C      Allergies    Dog epithelium allergy skin test, Cat hair extract, Flovent hfa [fluticasone], and Other    Review of Systems   Review of Systems  HENT:  Positive for trouble swallowing.   All other systems reviewed and are negative.   Physical Exam Updated Vital Signs BP (!) 149/78   Pulse (!) 55   Temp 98.4 F (36.9 C)   Resp 18   SpO2 93%  Physical Exam Vitals and nursing note reviewed.  Constitutional:      Appearance: Normal appearance.     Comments: Chronically ill   HENT:     Head: Normocephalic.     Mouth/Throat:     Mouth:  Mucous membranes are dry.  Eyes:     Pupils: Pupils are equal, round, and reactive to light.  Cardiovascular:     Rate and Rhythm: Normal rate and regular rhythm.     Pulses: Normal pulses.     Heart sounds: Normal heart sounds.  Pulmonary:     Effort: Pulmonary effort is normal.     Breath sounds: Normal breath sounds.     Comments: Lungs are clear Abdominal:     General: Abdomen is flat.     Palpations: Abdomen is soft.  Musculoskeletal:        General: Normal range of motion.     Cervical back: Normal range of motion and neck supple.  Skin:    General: Skin is warm.     Capillary Refill: Capillary refill takes less than 2 seconds.  Neurological:     General: No focal deficit present.     Mental Status: He is alert and oriented to person, place, and time.  Psychiatric:        Mood and Affect: Mood normal.        Behavior: Behavior normal.     ED Results / Procedures / Treatments   Labs (all labs ordered are listed, but only abnormal results are displayed) Labs Reviewed  CBC - Abnormal; Notable for the  following components:      Result Value   MCV 101.9 (*)    All other components within normal limits  COMPREHENSIVE METABOLIC PANEL - Abnormal; Notable for the following components:   Glucose, Bld 113 (*)    All other components within normal limits  LIPASE, BLOOD    EKG None  Radiology DG Chest 2 View  Result Date: 08/09/2022 CLINICAL DATA:  Unable to keep food or water down after he drinks. EXAM: CHEST - 2 VIEW COMPARISON:  None Available. FINDINGS: The heart size and mediastinal contours are within normal limits. There is mild left basilar linear scarring and/or atelectasis. There is no evidence of an acute infiltrate, pleural effusion or pneumothorax. Multilevel degenerative changes seen throughout the thoracic spine. IMPRESSION: Mild left basilar linear scarring and/or atelectasis. Electronically Signed   By: Virgina Norfolk M.D.   On: 08/09/2022 17:12     Procedures Procedures    Medications Ordered in ED Medications  metoCLOPramide (REGLAN) injection 10 mg (10 mg Intravenous Given 08/09/22 1846)  diphenhydrAMINE (BENADRYL) injection 12.5 mg (12.5 mg Intravenous Given 08/09/22 1846)  glucagon (human recombinant) (GLUCAGEN) injection 1 mg (1 mg Intravenous Given 08/09/22 1846)  famotidine (PEPCID) IVPB 20 mg premix (20 mg Intravenous New Bag/Given 08/09/22 1852)    ED Course/ Medical Decision Making/ A&P                             Medical Decision Making Jeremy Li is a 82 y.o. male here presenting with possible food impaction.  He felt that it was stuck in his chest.  I think likely the sausage that he ate yesterday.  Will try glucagon and Reglan and get a chest x-ray.  9:26 PM Patient tolerated p.o. after glucagon and Reglan.  Will discharge home with Nexium.  Will refer to GI for endoscopy.  Told him to eat soft diet for several days   Problems Addressed: Food impaction of esophagus, initial encounter: acute illness or injury  Amount and/or Complexity of Data Reviewed Labs: ordered. Decision-making details documented in ED Course. Radiology: ordered and independent interpretation performed. Decision-making details documented in ED Course.  Risk Prescription drug management.    Final Clinical Impression(s) / ED Diagnoses Final diagnoses:  None    Rx / DC Orders ED Discharge Orders     None         Drenda Freeze, MD 08/09/22 2127

## 2022-08-09 NOTE — ED Provider Notes (Signed)
Patient here today for evaluation of recurrent vomiting without nausea. He reports symptoms started yesterday abruptly at lunch. He has not held down any food or liquid since that time. He denies any abdominal pain. He does note that for a while he has had more problems finishing dinner due to similar symptoms. Recommended further evaluation in the ED for stat imaging given concerns for blockage, developing dehydration. Patient is agreeable and he and wife will transport to ED via [facility].    Francene Finders, PA-C 08/09/22 1551

## 2022-08-09 NOTE — ED Notes (Signed)
Patient is being discharged from the Urgent Care and sent to the Emergency Department via [facility] with wife. Per [name], [name], patient is in need of higher level of care due to N/V.Marland Kitchen Patient is aware and verbalizes understanding of plan of care.  Vitals:   08/09/22 1534  BP: (!) 158/78  Pulse: 60  Resp: 18  Temp: 98.6 F (37 C)  SpO2: 95%

## 2022-08-09 NOTE — Discharge Instructions (Addendum)
Take nexium daily.   Please call GI on Monday for endoscopy  Soft diet for the next several days  Return to ER if you notice that you cannot tolerate any fluids or you have vomiting

## 2022-08-09 NOTE — ED Triage Notes (Signed)
Pt here sent from UC due to not being able to keep food or water down after he drinks it , pt thinks that he may have some reflux but doesn't take meds for it, had BM today

## 2022-08-09 NOTE — ED Triage Notes (Signed)
Pt presents with nausea and vomiting since yesterday with no complaints of abdominal pain or any other symptoms other than gassiness.

## 2022-08-09 NOTE — ED Notes (Signed)
Passed fluid challenge. No issues.

## 2022-08-09 NOTE — ED Provider Triage Note (Addendum)
Emergency Medicine Provider Triage Evaluation Note  Jeremy Li , a 82 y.o. male  was evaluated in triage.  Pt complains of dysphagia sensation, inability of swallow for the last 2 to 3 days, patient reports that he is able to complete meals at breakfast, lunch but at some point during the day feels like a transition occurs where he is no longer able to tolerate either fluids or solids, patient denies any bleeding, pain, nausea, reports that after he tries to drink something it comes straight back up.  Patient currently tolerating his own saliva without difficulty.  He does endorse some occasional acid reflux but denies previous esophageal stricture, does not see a GI doctor regularly.  Review of Systems  Positive: dysphagia Negative: Nausea, hematemesis  Physical Exam  BP 133/83 (BP Location: Left Arm)   Pulse (!) 58   Temp 98.4 F (36.9 C) (Oral)   Resp 18   SpO2 91%  Gen:   Awake, no distress   Resp:  Normal effort  MSK:   Moves extremities without difficulty  Other:    Medical Decision Making  Medically screening exam initiated at 4:44 PM.  Appropriate orders placed.  Jeremy Li was informed that the remainder of the evaluation will be completed by another provider, this initial triage assessment does not replace that evaluation, and the importance of remaining in the ED until their evaluation is complete.  Workup initiated in triage   Radiology reports esophagram cannot be completed on emergent basis based on current staff, they had planned to perform an on Monday, will need to evaluate whether this is a study that needs to be done emergently for this patient once they were seen and evaluated in a room.   Jeremy Pickler, PA-C 08/09/22 1646    Jeremy Li, [place] 08/09/22 1653

## 2022-08-13 ENCOUNTER — Other Ambulatory Visit (HOSPITAL_COMMUNITY): Payer: Self-pay | Admitting: Physician Assistant

## 2022-08-13 DIAGNOSIS — Z1211 Encounter for screening for malignant neoplasm of colon: Secondary | ICD-10-CM | POA: Diagnosis not present

## 2022-08-13 DIAGNOSIS — R1310 Dysphagia, unspecified: Secondary | ICD-10-CM | POA: Diagnosis not present

## 2022-08-13 DIAGNOSIS — T18128A Food in esophagus causing other injury, initial encounter: Secondary | ICD-10-CM | POA: Diagnosis not present

## 2022-08-13 DIAGNOSIS — K219 Gastro-esophageal reflux disease without esophagitis: Secondary | ICD-10-CM | POA: Diagnosis not present

## 2022-08-14 ENCOUNTER — Telehealth: Payer: Self-pay

## 2022-08-14 NOTE — Telephone Encounter (Signed)
        Patient  visited [facility] on 1/19    Telephone encounter attempt :  1st  A HIPAA compliant voice message was left requesting a return call.  Instructed patient to call back    [facility], [facility]  [phone] [address], [place], [place] [address] Phone: [phone] Email: [email or web address]

## 2022-08-15 ENCOUNTER — Telehealth: Payer: Self-pay

## 2022-08-15 NOTE — Telephone Encounter (Signed)
        Patient  visited [facility] on 1/19   Telephone encounter attempt :  2nd  A HIPAA compliant voice message was left requesting a return call.  Instructed patient to call back    [facility], [facility]  [phone] [address], [place], [place] [address] Phone: [phone] Email: [email or web address]

## 2022-08-16 ENCOUNTER — Ambulatory Visit: Payer: PPO | Admitting: Physician Assistant

## 2022-08-23 ENCOUNTER — Ambulatory Visit (HOSPITAL_COMMUNITY)
Admission: RE | Admit: 2022-08-23 | Discharge: 2022-08-23 | Disposition: A | Discharge status: Home / Self Care | Payer: PPO | Source: Ambulatory Visit | Attending: Physician Assistant | Admitting: Physician Assistant

## 2022-08-23 DIAGNOSIS — K219 Gastro-esophageal reflux disease without esophagitis: Secondary | ICD-10-CM | POA: Insufficient documentation

## 2022-08-23 DIAGNOSIS — K449 Diaphragmatic hernia without obstruction or gangrene: Secondary | ICD-10-CM | POA: Diagnosis not present

## 2022-08-23 DIAGNOSIS — R1310 Dysphagia, unspecified: Secondary | ICD-10-CM | POA: Diagnosis not present

## 2022-09-04 ENCOUNTER — Encounter: Payer: Self-pay | Admitting: Family Medicine

## 2022-09-04 NOTE — Progress Notes (Unsigned)
   Established Patient Office Visit  Subjective   Patient ID: Jeremy Li, male    DOB: 1941/06/21  Age: 82 y.o. MRN: [id number]  No chief complaint on file.   HPI  {History (Optional):23778}  ROS    Objective:     There were no vitals taken for this visit. {Vitals History (Optional):23777}  Physical Exam   No results found for any visits on 09/05/22.  {Labs (Optional):23779}  The ASCVD Risk score (Arnett DK, et al., 2019) failed to calculate for the following reasons:   The 2019 ASCVD risk score is only valid for ages 72 to 66    Assessment & Plan:   Problem List Items Addressed This Visit   None   No follow-ups on file.    Velva Harman, PA

## 2022-09-05 ENCOUNTER — Encounter: Payer: Self-pay | Admitting: Family Medicine

## 2022-09-05 ENCOUNTER — Ambulatory Visit (INDEPENDENT_AMBULATORY_CARE_PROVIDER_SITE_OTHER): Payer: PPO | Admitting: Family Medicine

## 2022-09-05 VITALS — BP 133/75 | HR 69 | Resp 18 | Ht 72.84 in | Wt 201.0 lb

## 2022-09-05 DIAGNOSIS — C439 Malignant melanoma of skin, unspecified: Secondary | ICD-10-CM | POA: Diagnosis not present

## 2022-09-05 DIAGNOSIS — K219 Gastro-esophageal reflux disease without esophagitis: Secondary | ICD-10-CM | POA: Diagnosis not present

## 2022-09-05 DIAGNOSIS — E7800 Pure hypercholesterolemia, unspecified: Secondary | ICD-10-CM

## 2022-09-05 MED ORDER — ESOMEPRAZOLE MAGNESIUM 40 MG PO CPDR: 40.0000 mg | DELAYED_RELEASE_CAPSULE | Freq: Every day | ORAL | 0 refills | Status: DC

## 2022-09-05 MED ORDER — ESOMEPRAZOLE MAGNESIUM 20 MG PO CPDR: 20.0000 mg | DELAYED_RELEASE_CAPSULE | Freq: Every day | ORAL | 1 refills | Status: DC

## 2022-09-05 NOTE — Assessment & Plan Note (Addendum)
Patient did see gastroenterology and will continue follow-up with them, he has elected not to have an endoscopy as he feels that the risk outweighs the benefits for him.  Continue Nexium 20 mg daily, may increase to 64m daily if 258mis not enough, and we will follow-up in 6 months.

## 2022-09-05 NOTE — Assessment & Plan Note (Signed)
Lipid values in the emergency department were within normal limits.  Follow-up in 6 months and we will repeat labs at that time.

## 2022-09-05 NOTE — Assessment & Plan Note (Signed)
Previously removed.  Follows up with dermatology at least annually.  Will continue to monitor.

## 2022-09-23 DIAGNOSIS — Q394 Esophageal web: Secondary | ICD-10-CM | POA: Diagnosis not present

## 2022-09-23 DIAGNOSIS — K219 Gastro-esophageal reflux disease without esophagitis: Secondary | ICD-10-CM | POA: Diagnosis not present

## 2022-09-23 DIAGNOSIS — Z1211 Encounter for screening for malignant neoplasm of colon: Secondary | ICD-10-CM | POA: Diagnosis not present

## 2022-09-30 DIAGNOSIS — Z1211 Encounter for screening for malignant neoplasm of colon: Secondary | ICD-10-CM | POA: Diagnosis not present

## 2022-09-30 DIAGNOSIS — Z1212 Encounter for screening for malignant neoplasm of rectum: Secondary | ICD-10-CM | POA: Diagnosis not present

## 2022-10-04 LAB — COLOGUARD: Cologuard: POSITIVE — AB

## 2022-10-16 ENCOUNTER — Other Ambulatory Visit: Payer: Self-pay

## 2022-10-16 DIAGNOSIS — J4540 Moderate persistent asthma, uncomplicated: Secondary | ICD-10-CM

## 2022-10-16 MED ORDER — BUDESONIDE-FORMOTEROL FUMARATE 80-4.5 MCG/ACT IN AERO: 2.0000 | INHALATION_SPRAY | Freq: Two times a day (BID) | RESPIRATORY_TRACT | 12 refills | Status: DC

## 2022-10-18 ENCOUNTER — Encounter: Payer: Self-pay | Admitting: Family Medicine

## 2022-10-18 DIAGNOSIS — J4540 Moderate persistent asthma, uncomplicated: Secondary | ICD-10-CM

## 2022-10-21 MED ORDER — FLUTICASONE-SALMETEROL 100-50 MCG/ACT IN AEPB: 1.0000 | INHALATION_SPRAY | Freq: Two times a day (BID) | RESPIRATORY_TRACT | 3 refills | Status: DC

## 2022-11-20 ENCOUNTER — Telehealth: Payer: Self-pay

## 2022-11-20 NOTE — Telephone Encounter (Signed)
Patient is wanting to transfer his care from [facility] to our office. He use to see Inetta Fermo at [facility]. Made appointment for patient on 11/29/2022 with Inetta Fermo. Informed patient that if we needed his medical records before his appointment I would call him and let him know. Inetta Fermo states she would like this before his appointment. Called patient back and informed him this information and informed him he could come to our office or [facility] to sign medical release form. He states they will come to our office either today or tomorrow to sign that medical release form.

## 2022-11-26 ENCOUNTER — Ambulatory Visit (INDEPENDENT_AMBULATORY_CARE_PROVIDER_SITE_OTHER): Payer: PPO

## 2022-11-26 ENCOUNTER — Other Ambulatory Visit: Payer: Self-pay

## 2022-11-26 VITALS — Ht 72.0 in | Wt 201.0 lb

## 2022-11-26 DIAGNOSIS — Z0000 Encounter for general adult medical examination without abnormal findings: Secondary | ICD-10-CM

## 2022-11-26 NOTE — Patient Instructions (Addendum)
[name] , Thank you for taking time to come for your Medicare Wellness Visit. I appreciate your ongoing commitment to your health goals. Please review the following plan we discussed and let me know if I can assist you in the future.   These are the goals we discussed:  Goals       Stay Healthy (pt-stated)        This is a list of the screening recommended for you and due dates:  Health Maintenance  Topic Date Due   COVID-19 Vaccine (5 - 2023-24 season) 12/12/2022*   Flu Shot  02/20/2023   Medicare Annual Wellness Visit  11/26/2023   DTaP/Tdap/Td vaccine (3 - Td or Tdap) 01/07/2029   Pneumonia Vaccine  Completed   Zoster (Shingles) Vaccine  Completed   HPV Vaccine  Aged Out  *Topic was postponed. The date shown is not the original due date.    Advanced directives: Please bring a copy of your health care power of attorney and living will to the office to be added to your chart at your convenience.   Conditions/risks identified: None  Next appointment: Follow up in one year for your annual wellness visit.    Preventive Care 33 Years and Older, Male  Preventive care refers to lifestyle choices and visits with your health care provider that can promote health and wellness. What does preventive care include? A yearly physical exam. This is also called an annual well check. Dental exams once or twice a year. Routine eye exams. Ask your health care provider how often you should have your eyes checked. Personal lifestyle choices, including: Daily care of your teeth and gums. Regular physical activity. Eating a healthy diet. Avoiding tobacco and drug use. Limiting alcohol use. Practicing safe sex. Taking low doses of aspirin every day. Taking vitamin and mineral supplements as recommended by your health care provider. What happens during an annual well check? The services and screenings done by your health care provider during your annual well check will depend on your age,  overall health, lifestyle risk factors, and family history of disease. Counseling  Your health care provider may ask you questions about your: Alcohol use. Tobacco use. Drug use. Emotional well-being. Home and relationship well-being. Sexual activity. Eating habits. History of falls. Memory and ability to understand (cognition). Work and work Astronomer. Screening  You may have the following tests or measurements: Height, weight, and BMI. Blood pressure. Lipid and cholesterol levels. These may be checked every 5 years, or more frequently if you are over 96 years old. Skin check. Lung cancer screening. You may have this screening every year starting at age 14 if you have a 30-pack-year history of smoking and currently smoke or have quit within the past 15 years. Fecal occult blood test (FOBT) of the stool. You may have this test every year starting at age 71. Flexible sigmoidoscopy or colonoscopy. You may have a sigmoidoscopy every 5 years or a colonoscopy every 10 years starting at age 19. Prostate cancer screening. Recommendations will vary depending on your family history and other risks. Hepatitis C blood test. Hepatitis B blood test. Sexually transmitted disease (STD) testing. Diabetes screening. This is done by checking your blood sugar (glucose) after you have not eaten for a while (fasting). You may have this done every 1-3 years. Abdominal aortic aneurysm (AAA) screening. You may need this if you are a current or former smoker. Osteoporosis. You may be screened starting at age 51 if you are at high  risk. Talk with your health care provider about your test results, treatment options, and if necessary, the need for more tests. Vaccines  Your health care provider may recommend certain vaccines, such as: Influenza vaccine. This is recommended every year. Tetanus, diphtheria, and acellular pertussis (Tdap, Td) vaccine. You may need a Td booster every 10 years. Zoster vaccine.  You may need this after age 7. Pneumococcal 13-valent conjugate (PCV13) vaccine. One dose is recommended after age 10. Pneumococcal polysaccharide (PPSV23) vaccine. One dose is recommended after age 14. Talk to your health care provider about which screenings and vaccines you need and how often you need them. This information is not intended to replace advice given to you by your health care provider. Make sure you discuss any questions you have with your health care provider. Document Released: 08/04/2015 Document Revised: 03/27/2016 Document Reviewed: 05/09/2015 Elsevier Interactive Patient Education  2017 [place] Prevention in the Home Falls can cause injuries. They can happen to people of all ages. There are many things you can do to make your home safe and to help prevent falls. What can I do on the outside of my home? Regularly fix the edges of walkways and driveways and fix any cracks. Remove anything that might make you trip as you walk through a door, such as a raised step or threshold. Trim any bushes or trees on the path to your home. Use bright outdoor lighting. Clear any walking paths of anything that might make someone trip, such as rocks or tools. Regularly check to see if handrails are loose or broken. Make sure that both sides of any steps have handrails. Any raised decks and porches should have guardrails on the edges. Have any leaves, snow, or ice cleared regularly. Use sand or salt on walking paths during winter. Clean up any spills in your garage right away. This includes oil or grease spills. What can I do in the bathroom? Use night lights. Install grab bars by the toilet and in the tub and shower. Do not use towel bars as grab bars. Use non-skid mats or decals in the tub or shower. If you need to sit down in the shower, use a plastic, non-slip stool. Keep the floor dry. Clean up any water that spills on the floor as soon as it happens. Remove soap  buildup in the tub or shower regularly. Attach bath mats securely with double-sided non-slip rug tape. Do not have throw rugs and other things on the floor that can make you trip. What can I do in the bedroom? Use night lights. Make sure that you have a light by your bed that is easy to reach. Do not use any sheets or blankets that are too big for your bed. They should not hang down onto the floor. Have a firm chair that has side arms. You can use this for support while you get dressed. Do not have throw rugs and other things on the floor that can make you trip. What can I do in the kitchen? Clean up any spills right away. Avoid walking on wet floors. Keep items that you use a lot in easy-to-reach places. If you need to reach something above you, use a strong step stool that has a grab bar. Keep electrical cords out of the way. Do not use floor polish or wax that makes floors slippery. If you must use wax, use non-skid floor wax. Do not have throw rugs and other things on the floor that can  make you trip. What can I do with my stairs? Do not leave any items on the stairs. Make sure that there are handrails on both sides of the stairs and use them. Fix handrails that are broken or loose. Make sure that handrails are as long as the stairways. Check any carpeting to make sure that it is firmly attached to the stairs. Fix any carpet that is loose or worn. Avoid having throw rugs at the top or bottom of the stairs. If you do have throw rugs, attach them to the floor with carpet tape. Make sure that you have a light switch at the top of the stairs and the bottom of the stairs. If you do not have them, ask someone to add them for you. What else can I do to help prevent falls? Wear shoes that: Do not have high heels. Have rubber bottoms. Are comfortable and fit you well. Are closed at the toe. Do not wear sandals. If you use a stepladder: Make sure that it is fully opened. Do not climb a closed  stepladder. Make sure that both sides of the stepladder are locked into place. Ask someone to hold it for you, if possible. Clearly mark and make sure that you can see: Any grab bars or handrails. First and last steps. Where the edge of each step is. Use tools that help you move around (mobility aids) if they are needed. These include: Canes. Walkers. Scooters. Crutches. Turn on the lights when you go into a dark area. Replace any light bulbs as soon as they burn out. Set up your furniture so you have a clear path. Avoid moving your furniture around. If any of your floors are uneven, fix them. If there are any pets around you, be aware of where they are. Review your medicines with your doctor. Some medicines can make you feel dizzy. This can increase your chance of falling. Ask your doctor what other things that you can do to help prevent falls. This information is not intended to replace advice given to you by your health care provider. Make sure you discuss any questions you have with your health care provider. Document Released: 05/04/2009 Document Revised: 12/14/2015 Document Reviewed: 08/12/2014 Elsevier Interactive Patient Education  2017 [employer].

## 2022-11-26 NOTE — Progress Notes (Signed)
Subjective:   Jeremy Li is a 82 y.o. male who presents for Medicare Annual/Subsequent preventive examination.  Review of Systems    Virtual Visit via Telephone Note  I connected with  Jeremy Li on 11/26/22 at  2:00 PM EDT by telephone and verified that I am speaking with the correct person using two identifiers.  Location: Patient: Home Provider: Office Persons participating in the virtual visit: patient/Nurse Health Advisor   I discussed the limitations, risks, security and privacy concerns of performing an evaluation and management service by telephone and the availability of in person appointments. The patient expressed understanding and agreed to proceed.  Interactive audio and video telecommunications were attempted between this nurse and patient, however failed, due to patient having technical difficulties OR patient did not have access to video capability.  We continued and completed visit with audio only.  Some vital signs may be absent or patient reported.   Tillie Rung, LPN  Cardiac Risk Factors include: advanced age (>12men, >53 women)     Objective:    Today's Vitals   11/26/22 1331  Weight: 201 lb (91.2 kg)  Height: 6' (1.829 m)   Body mass index is 27.26 kg/m.     11/26/2022    1:40 PM 08/05/2017   11:09 AM  Advanced Directives  Does Patient Have a Medical Advance Directive? Yes Yes  Type of Estate agent of [place];Living will Living will;Healthcare Power of Attorney  Copy of Healthcare Power of Attorney in Chart? No - copy requested No - copy requested    Current Medications (verified) Outpatient Encounter Medications as of 11/26/2022  Medication Sig   Cholecalciferol (VITAMIN D3) 2000 units capsule Take 1 capsule (2,000 Units total) by mouth daily.   esomeprazole (NEXIUM) 20 MG capsule Take 1 capsule (20 mg total) by mouth daily at 12 noon. You may take up to 2 a day if you feel that 1 is not enough to manage symptoms.    fluticasone-salmeterol (ADVAIR) 100-50 MCG/ACT AEPB Inhale 1 puff into the lungs 2 (two) times daily.   loratadine (CLARITIN) 10 MG tablet Take 10 mg by mouth daily.   montelukast (SINGULAIR) 10 MG tablet Take 1 tablet (10 mg total) by mouth at bedtime.   No facility-administered encounter medications on file as of 11/26/2022.    Allergies (verified) Dog epithelium (canis lupus familiaris), Cat hair extract, Flovent hfa [fluticasone], and Other   History: Past Medical History:  Diagnosis Date   Allergy    pets & seasonal allergens   Past Surgical History:  Procedure Laterality Date   CATARACT EXTRACTION, BILATERAL     Dr Dione Booze   DUPUYTREN / PALMAR FASCIOTOMY     Dr Teressa Senter   EYE SURGERY N/A    [employer] 01/17/2020   HERNIA REPAIR     LUMBAR DISC SURGERY  1989   Dr Lynnette Caffey   TONSILLECTOMY     Family History  Problem Relation Age of Onset   Heart disease Sister        cardiomegaly   COPD Neg Hx    Asthma Neg Hx    Cancer Neg Hx    Diabetes Neg Hx    Stroke Neg Hx    Social History   Socioeconomic History   Marital status: Married    Spouse name: Not on file   Number of children: 3   Years of education: 14   Highest education level: Not on file  Occupational History   Occupation: Retired  Tobacco Use   Smoking status: Former    Packs/day: 4.00    Years: 0.50    Additional pack years: 0.00    Total pack years: 2.00    Types: Cigarettes    Quit date: 07/22/1961    Years since quitting: 61.3    Passive exposure: Never   Smokeless tobacco: Never   Tobacco comments:    smoked pipe 1-2 bowls/ day ages [age]  Vaping Use   Vaping Use: Never used  Substance and Sexual Activity   Alcohol use: Yes    Alcohol/week: 5.0 standard drinks of alcohol    Types: 5 Standard drinks or equivalent per week   Drug use: No   Sexual activity: Yes    Birth control/protection: None  Other Topics Concern   Not on file  Social History Narrative   Denies abuse and feels safe at  home.    Social Determinants of Health   Financial Resource Strain: Low Risk  (11/26/2022)   Overall Financial Resource Strain (CARDIA)    Difficulty of Paying Living Expenses: Not hard at all  Food Insecurity: No Food Insecurity (11/26/2022)   Hunger Vital Sign    Worried About Running Out of Food in the Last Year: Never true    Ran Out of Food in the Last Year: Never true  Transportation Needs: No Transportation Needs (11/26/2022)   PRAPARE - Administrator, Civil Service (Medical): No    Lack of Transportation (Non-Medical): No  Physical Activity: Insufficiently Active (11/26/2022)   Exercise Vital Sign    Days of Exercise per Week: 3 days    Minutes of Exercise per Session: 30 min  Stress: No Stress Concern Present (11/26/2022)   [employer] of Occupational Health - Occupational Stress Questionnaire    Feeling of Stress : Not at all  Social Connections: Socially Integrated (11/26/2022)   Social Connection and Isolation Panel [NHANES]    Frequency of Communication with Friends and Family: More than three times a week    Frequency of Social Gatherings with Friends and Family: More than three times a week    Attends Religious Services: More than 4 times per year    Active Member of [employer] or Organizations: Yes    Attends Engineer, structural: More than 4 times per year    Marital Status: Married    Tobacco Counseling Counseling given: Not Answered Tobacco comments: smoked pipe 1-2 bowls/ day ages [age]   Clinical Intake:  Pre-visit preparation completed: No  Pain : No/denies pain     BMI - recorded: 27.26 Nutritional Status: BMI 25 -29 Overweight Nutritional Risks: None Diabetes: No  How often do you need to have someone help you when you read instructions, pamphlets, or other written materials from your doctor or pharmacy?: 1 - Never  Diabetic? No  Interpreter Needed?: No  Information entered by :: Theresa Mulligan LPN   Activities of Daily  Living    11/26/2022    1:38 PM 09/05/2022   11:24 AM  In your present state of health, do you have any difficulty performing the following activities:  Hearing? 0 0  Vision? 0 0  Difficulty concentrating or making decisions? 0 0  Walking or climbing stairs? 0 0  Dressing or bathing? 0 0  Doing errands, shopping? 0 0  Preparing Food and eating ? N   Using the Toilet? N   In the past six months, have you accidently leaked urine? N   Do you have  problems with loss of bowel control? N   Managing your Medications? N   Managing your Finances? N   Housekeeping or managing your Housekeeping? N     Patient Care Team: Melida Quitter, PA as PCP - General (Family Medicine) Cindee Salt, MD as Consulting Physician (Orthopedic Surgery) [place], Peter M, MD as Consulting Physician (Cardiology) Donzetta Starch, MD as Consulting Physician (Dermatology) Jerilee Field, MD as Consulting Physician (Urology) Elliot Cousin, OD as Referring Physician (Optometry)  Indicate any recent Medical Services you may have received from other than [facility] providers in the past year (date may be approximate).     Assessment:   This is a routine wellness examination for [place].  Hearing/Vision screen Hearing Screening - Comments:: Denies hearing difficulties   Vision Screening - Comments:: - up to date with routine eye exams with  [facility]  Dietary issues and exercise activities discussed: Current Exercise Habits: Home exercise routine, Type of exercise: walking, Time (Minutes): 30, Frequency (Times/Week): 3, Weekly Exercise (Minutes/Week): 90, Exercise limited by: None identified   Goals Addressed               This Visit's Progress     Stay Healthy (pt-stated)         Depression Screen    11/26/2022    1:38 PM 09/05/2022   11:23 AM 02/13/2022   10:14 AM 03/13/2021    3:15 PM 02/13/2021   10:18 AM 11/10/2020    9:17 AM 01/18/2020    1:10 PM  PHQ 2/9 Scores  PHQ - 2 Score 0 0 0 0 0 0 0  PHQ-  9 Score  0 0 0 0 0 0    Fall Risk    11/26/2022    1:39 PM 09/05/2022   11:24 AM 02/13/2022   10:14 AM 03/13/2021    3:15 PM 02/13/2021   10:18 AM  Fall Risk   Falls in the past year? 0 0 0 1 0  Number falls in past yr: 0 0 0 0 0  Injury with Fall? 0 0 0 0 0  Risk for fall due to : No Fall Risks   Impaired mobility No Fall Risks  Follow up Falls prevention discussed  Falls evaluation completed Falls evaluation completed Falls evaluation completed    FALL RISK PREVENTION PERTAINING TO THE HOME:  Any stairs in or around the home? Yes If so, are there any without handrails? No  Home free of loose throw rugs in walkways, pet beds, electrical cords, etc? Yes  Adequate lighting in your home to reduce risk of falls? Yes   ASSISTIVE DEVICES UTILIZED TO PREVENT FALLS:  Life alert? No  Use of a cane, walker or w/c? No  Grab bars in the bathroom? Yes  Shower chair or bench in shower? Yes Elevated toilet seat or a handicapped toilet? Yes  TIMED UP AND GO:  Was the test performed? No . Audio Visit   Cognitive Function:        02/13/2022   10:15 AM 02/13/2021   10:05 AM 01/18/2020    1:13 PM 07/07/2018    1:29 PM  6CIT Screen  What Year? 0 points 0 points 0 points 0 points  What month?  0 points 0 points 0 points  What time? 0 points 0 points 0 points 0 points  Count back from 20 0 points 0 points 0 points 0 points  Months in reverse 0 points 0 points 0 points 0 points  Repeat phrase 0  points 0 points 0 points 0 points  Total Score  0 points 0 points 0 points    Immunizations Immunization History  Administered Date(s) Administered   Influenza Whole 05/25/2008, 05/22/2009, 04/21/2012   Influenza, High Dose Seasonal PF 04/21/2014, 05/29/2015, 04/23/2017, 04/29/2018, 05/10/2019   Influenza, Seasonal, Injecte, Preservative Fre 05/07/2013   Influenza,inj,Quad PF,6+ Mos 04/23/2017   Influenza-Unspecified 05/29/2015, 04/23/2017, 05/10/2018   PFIZER(Purple Top)SARS-COV-2 Vaccination  08/11/2019, 09/01/2019, 04/24/2020, 10/26/2020   Pneumococcal Conjugate-13 04/21/2014   Pneumococcal Polysaccharide-23 05/29/2015   Td 09/27/2008   Tdap 01/08/2019   Zoster Recombinat (Shingrix) 06/28/2017, 10/07/2017   Zoster, Live 03/29/2014    TDAP status: Up to date  Flu Vaccine status: Up to date  Pneumococcal vaccine status: Up to date  Covid-19 vaccine status: Completed vaccines  Qualifies for Shingles Vaccine? Yes   Zostavax completed Yes   Shingrix Completed?: Yes  Screening Tests Health Maintenance  Topic Date Due   COVID-19 Vaccine (5 - 2023-24 season) 12/12/2022 (Originally 03/22/2022)   INFLUENZA VACCINE  02/20/2023   Medicare Annual Wellness (AWV)  11/26/2023   DTaP/Tdap/Td (3 - Td or Tdap) 01/07/2029   Pneumonia Vaccine 45+ Years old  Completed   Zoster Vaccines- Shingrix  Completed   HPV VACCINES  Aged Out    Health Maintenance  There are no preventive care reminders to display for this patient.   Colorectal cancer screening: No longer required.   Lung Cancer Screening: (Low Dose CT Chest recommended if Age 46-80 years, 30 pack-year currently smoking OR have quit w/in 15years.) does not qualify.     Additional Screening:  Hepatitis C Screening: does not qualify; Completed   Vision Screening: Recommended annual ophthalmology exams for early detection of glaucoma and other disorders of the eye. Is the patient up to date with their annual eye exam?  Yes  Who is the provider or what is the name of the office in which the patient attends annual eye exams? [facility] If pt is not established with a provider, would they like to be referred to a provider to establish care? No .   Dental Screening: Recommended annual dental exams for proper oral hygiene  Community Resource Referral / Chronic Care Management:  CRR required this visit?  No   CCM required this visit?  No      Plan:     I have personally reviewed and noted the following in the  patient's chart:   Medical and social history Use of alcohol, tobacco or illicit drugs  Current medications and supplements including opioid prescriptions. Patient is not currently taking opioid prescriptions. Functional ability and status Nutritional status Physical activity Advanced directives List of other physicians Hospitalizations, surgeries, and ER visits in previous 12 months Vitals Screenings to include cognitive, depression, and falls Referrals and appointments  In addition, I have reviewed and discussed with patient certain preventive protocols, quality metrics, and best practice recommendations. A written personalized care plan for preventive services as well as general preventive health recommendations were provided to patient.     Tillie Rung, LPN   07/27/1094   Nurse Notes: None

## 2022-11-27 NOTE — Telephone Encounter (Signed)
Called and left a message for the medical records department to send [place] the records because we have not receive them yet

## 2022-11-29 ENCOUNTER — Ambulatory Visit (INDEPENDENT_AMBULATORY_CARE_PROVIDER_SITE_OTHER): Payer: PPO | Admitting: Physician Assistant

## 2022-11-29 ENCOUNTER — Telehealth: Payer: Self-pay

## 2022-11-29 ENCOUNTER — Encounter: Payer: Self-pay | Admitting: Physician Assistant

## 2022-11-29 VITALS — BP 144/80 | HR 67 | Temp 98.0°F | Ht 73.0 in | Wt 207.0 lb

## 2022-11-29 DIAGNOSIS — R1310 Dysphagia, unspecified: Secondary | ICD-10-CM | POA: Diagnosis not present

## 2022-11-29 DIAGNOSIS — K219 Gastro-esophageal reflux disease without esophagitis: Secondary | ICD-10-CM | POA: Diagnosis not present

## 2022-11-29 DIAGNOSIS — R195 Other fecal abnormalities: Secondary | ICD-10-CM

## 2022-11-29 NOTE — Telephone Encounter (Signed)
Per Inetta Fermo- Virtual Colonoscopy- office notes,insurance information and order placed and faxed all to [facility]  [phone]

## 2022-11-29 NOTE — Progress Notes (Signed)
Celso Amy, PA-C [address]  [address]  [place], [place] [address]  Main: [phone]  Fax: [phone]   Gastroenterology Consultation  Referring Provider:     Melida Quitter, PA Primary Care Physician:  Melida Quitter, PA Primary Gastroenterologist:  Celso Amy, PA-C / Dr. Wyline Mood  Reason for Consultation:     Followup GERD, Dysphagia, Positive Cologuard        HPI:   Jeremy Li is a 82 y.o. y/o male referred for consultation & management  by Melida Quitter, PA.    Here for f/u of GERD, Dysphagia and Positive Cologuard.  He had food impaction which required ED evaluation at [facility] 08/09/2022.  Barium swallow with tablet done 08/2022 showed severe GERD.  Small hiatal hernia with Schatzki's ring resulting in delayed passage of barium tablet at the GE junction.  Pt. saw me at [facility] 09/2022 and was recommended to have an EGD with dilation.  Patient declined procedure due to advanced age and fear of esophageal perforation.  He started taking Nexium 20mg  daily and has not had any more episodes of dysphagia since then.  Has history of GERD which is currently controlled on Nexium 10 mg once daily.  He states he is not having any GI symptoms at all.  He has no more heartburn.  He is able to eat any type of food and drink without difficulty.  Has not had any episodes of dysphagia since January.  He has adamantly declined to schedule colonoscopy due to fear of colon perforation.  Labs 07/2022 showed normal CBC, CMP, and lipase.  Recent Screening Cologuard test was positive.  Patient denies any lower GI symptoms such as constipation, rectal bleeding, or weight loss.  No family history of colon cancer.   Past Medical History:  Diagnosis Date   Allergy    pets & seasonal allergens    Past Surgical History:  Procedure Laterality Date   CATARACT EXTRACTION, BILATERAL     Dr Dione Booze   DUPUYTREN / PALMAR FASCIOTOMY     Dr Teressa Senter   EYE SURGERY N/A     [employer] 01/17/2020   HERNIA REPAIR     LUMBAR DISC SURGERY  1989   Dr Lynnette Caffey   TONSILLECTOMY      Prior to Admission medications   Medication Sig Start Date End Date Taking? Authorizing Provider  Cholecalciferol (VITAMIN D3) 2000 units capsule Take 1 capsule (2,000 Units total) by mouth daily. 08/29/17  Yes Opalski, Gavin Pound, DO  esomeprazole (NEXIUM) 20 MG capsule Take 1 capsule (20 mg total) by mouth daily at 12 noon. You may take up to 2 a day if you feel that 1 is not enough to manage symptoms. 09/05/22  Yes Saralyn Pilar A, PA  fluticasone-salmeterol (ADVAIR) 100-50 MCG/ACT AEPB Inhale 1 puff into the lungs 2 (two) times daily. 10/21/22  Yes Saralyn Pilar A, PA  loratadine (CLARITIN) 10 MG tablet Take 10 mg by mouth daily.   Yes [provider]  montelukast (SINGULAIR) 10 MG tablet Take 1 tablet (10 mg total) by mouth at bedtime. 02/13/22  Yes Abonza, Maritza, PA-C  Vitamin D, Ergocalciferol, (DRISDOL) 1.25 MG (50000 UNIT) CAPS capsule Take 50,000 Units by mouth. 08/29/17  Yes [provider]    Family History  Problem Relation Age of Onset   Heart disease Sister        cardiomegaly   COPD Neg Hx    Asthma Neg Hx  Cancer Neg Hx    Diabetes Neg Hx    Stroke Neg Hx      Social History   Tobacco Use   Smoking status: Former    Packs/day: 4.00    Years: 0.50    Additional pack years: 0.00    Total pack years: 2.00    Types: Cigarettes    Quit date: 07/22/1961    Years since quitting: 61.3    Passive exposure: Never   Smokeless tobacco: Never   Tobacco comments:    smoked pipe 1-2 bowls/ day ages [age]  Vaping Use   Vaping Use: Never used  Substance Use Topics   Alcohol use: Yes    Alcohol/week: 5.0 standard drinks of alcohol    Types: 5 Standard drinks or equivalent per week   Drug use: No    Allergies as of 11/29/2022 - Review Complete 11/29/2022  Allergen Reaction Noted   Dog epithelium (canis lupus familiaris) Shortness Of Breath 02/24/2018    Cat hair extract  01/06/2018   Flovent hfa [fluticasone]  02/24/2018   Other  01/06/2018    Review of Systems:    All systems reviewed and negative except where noted in HPI.   Physical Exam:  BP (!) 144/80   Pulse 67   Temp 98 F (36.7 C)   Ht 6\' 1"  (1.854 m)   Wt 207 lb (93.9 kg)   BMI 27.31 kg/m  No LMP for male patient. Psych:  Alert and cooperative. Normal mood and affect. General:   Alert,  Well-developed, well-nourished, pleasant and cooperative in NAD Head:  Normocephalic and atraumatic. Eyes:  Sclera clear, no icterus.   Conjunctiva pink. Ears:  Normal auditory acuity. Neck:  Supple; no masses or thyromegaly. Lungs:  Respirations even and unlabored.  Clear throughout to auscultation.   No wheezes, crackles, or rhonchi. No acute distress. Heart:  Regular rate and rhythm; no murmurs, clicks, rubs, or gallops. Abdomen:  Normal bowel sounds.  No bruits.  Soft, obese and non-tender without masses or hepatosplenomegaly.  There is moderate rectus diastases and large umbilical hernia which is reducible and not tender.  No guarding or rebound tenderness.    Neurologic:  Alert and oriented x3;  grossly normal neurologically. Psych:  Alert and cooperative. Normal mood and affect.  Imaging Studies: No results found.  Assessment and Plan:   Jeremy Li is a 82 y.o. y/o male has been referred for   GERD Continue Nexium 10mg  Daily. Recommend Lifestyle Modifications to prevent Acid Reflux.  Rec. Avoid coffee, sodas, peppermint, citrus fruits, and spicey foods.  Avoid eating 2-3 hours before bedtime.   Dysphagia - Resolved on Nexium.  Pt. Adamantly refuses EGD w/ DIL.  New Nexium daily.  Follow-up if he has recurrent dysphagia.  Schatzki's Ring  Pt. Adamantly refuses EGD w/ DIL due to advanced age and fear of esophageal perforation.  Positive Cologuard  Scheduling Virtual Colonoscopy  Pt. Adamantly refuses to schedule traditional Colonoscopy due to fear of colon  perforation.  5.  Medium umbilical hernia and rectus diastases - reducible and nontender.  Offered surgical referral to discuss umbilical hernia repair and patient declined.   He will let me know if it becomes symptomatic.  Follow up in 6 months.  Celso Amy, PA-C

## 2022-12-02 ENCOUNTER — Telehealth: Payer: Self-pay

## 2022-12-02 ENCOUNTER — Other Ambulatory Visit: Payer: Self-pay

## 2022-12-02 ENCOUNTER — Telehealth: Payer: Self-pay | Admitting: Physician Assistant

## 2022-12-02 NOTE — Telephone Encounter (Signed)
error 

## 2022-12-02 NOTE — Telephone Encounter (Signed)
Pt LVMM returning call --

## 2022-12-02 NOTE — Telephone Encounter (Signed)
LVM for patient to return call to office at this time.

## 2022-12-03 ENCOUNTER — Telehealth: Payer: Self-pay

## 2022-12-03 NOTE — Telephone Encounter (Signed)
-----   Message from Celso Amy, [place] sent at 12/03/2022  1:05 PM EDT ----- I called and notified pt. About Positive Cologuard.  I also discussed this with him at recent office visit.  I recommended traditional colonoscopy and patient refused.  Then I ordered virtual colonoscopy.  Patient recently canceled virtual colonoscopy. Inetta Fermo ----- Message ----- From: Denman George, CMA Sent: 12/02/2022   1:30 PM EDT To: Celso Amy, PA-C

## 2022-12-04 ENCOUNTER — Telehealth: Payer: Self-pay

## 2022-12-04 ENCOUNTER — Encounter: Payer: Self-pay | Admitting: Physician Assistant

## 2022-12-04 DIAGNOSIS — R195 Other fecal abnormalities: Secondary | ICD-10-CM

## 2022-12-04 NOTE — Telephone Encounter (Signed)
Spoke with patient he will come tomorrow 12/05/22 for CEA lab.

## 2022-12-05 DIAGNOSIS — R195 Other fecal abnormalities: Secondary | ICD-10-CM | POA: Diagnosis not present

## 2022-12-06 LAB — CEA: CEA: 2.4 ng/mL (ref 0.0–4.7)

## 2022-12-07 ENCOUNTER — Encounter: Payer: Self-pay | Admitting: Physician Assistant

## 2022-12-10 DIAGNOSIS — H40013 Open angle with borderline findings, low risk, bilateral: Secondary | ICD-10-CM | POA: Diagnosis not present

## 2022-12-31 ENCOUNTER — Encounter: Payer: Self-pay | Admitting: Family Medicine

## 2022-12-31 NOTE — Telephone Encounter (Signed)
Unfortunately, it was scheduled to early and so insurance is not wanting to cover it

## 2023-01-09 ENCOUNTER — Ambulatory Visit: Payer: PPO

## 2023-02-10 DIAGNOSIS — D1801 Hemangioma of skin and subcutaneous tissue: Secondary | ICD-10-CM | POA: Diagnosis not present

## 2023-02-10 DIAGNOSIS — L723 Sebaceous cyst: Secondary | ICD-10-CM | POA: Diagnosis not present

## 2023-02-10 DIAGNOSIS — L821 Other seborrheic keratosis: Secondary | ICD-10-CM | POA: Diagnosis not present

## 2023-02-10 DIAGNOSIS — L218 Other seborrheic dermatitis: Secondary | ICD-10-CM | POA: Diagnosis not present

## 2023-02-10 DIAGNOSIS — D3617 Benign neoplasm of peripheral nerves and autonomic nervous system of trunk, unspecified: Secondary | ICD-10-CM | POA: Diagnosis not present

## 2023-02-17 ENCOUNTER — Encounter: Payer: Self-pay | Admitting: Family Medicine

## 2023-02-17 DIAGNOSIS — J4540 Moderate persistent asthma, uncomplicated: Secondary | ICD-10-CM

## 2023-02-17 MED ORDER — FLUTICASONE-SALMETEROL 100-50 MCG/ACT IN AEPB: 1.0000 | INHALATION_SPRAY | Freq: Two times a day (BID) | RESPIRATORY_TRACT | 3 refills | Status: DC

## 2023-03-05 ENCOUNTER — Other Ambulatory Visit: Payer: Self-pay | Admitting: Family Medicine

## 2023-03-05 DIAGNOSIS — K219 Gastro-esophageal reflux disease without esophagitis: Secondary | ICD-10-CM

## 2023-03-06 ENCOUNTER — Ambulatory Visit (INDEPENDENT_AMBULATORY_CARE_PROVIDER_SITE_OTHER): Payer: PPO | Admitting: Family Medicine

## 2023-03-06 ENCOUNTER — Encounter: Payer: Self-pay | Admitting: Family Medicine

## 2023-03-06 VITALS — BP 133/78 | HR 60 | Ht 73.0 in | Wt 207.0 lb

## 2023-03-06 DIAGNOSIS — R030 Elevated blood-pressure reading, without diagnosis of hypertension: Secondary | ICD-10-CM

## 2023-03-06 DIAGNOSIS — K219 Gastro-esophageal reflux disease without esophagitis: Secondary | ICD-10-CM | POA: Diagnosis not present

## 2023-03-06 NOTE — Patient Instructions (Signed)
It was nice to see you today,  We addressed the following topics today: - continue taking your medications as prescribed - follow up in 6 months  Have a great day,  Frederic Jericho, MD

## 2023-03-06 NOTE — Assessment & Plan Note (Signed)
Initial reading was greater than 150 systolic.  Repeat was 133/78.  Patient does not want to take medications.  Continue to monitor.

## 2023-03-06 NOTE — Progress Notes (Signed)
   Established Patient Office Visit  Subjective   Patient ID: Jeremy Li, male    DOB: 07/01/1941  Age: 82 y.o. MRN: [id number]  Chief Complaint  Patient presents with   Medical Management of Chronic Issues    HPI  Jeremy Li -patient has been doing fine on 10 mg of Nexium.  No issues.  Tolerating medicine just fine.  Knows he will be on at most likely lifelong.  Does not eat foods that trigger it.  He eats healthy, fresh meals cooked by him and his wife.  Patient also takes Advair for asthma, takes lutein  for eye health,.  Patient discussed his positive Cologuard and the subsequent CEA testing.  Patient insistent on not getting a colonoscopy.  Patient states that his blood pressure is normal when he checks it at home.  Has no interest in starting a blood pressure medication.  Patient sees Dr. [place], cardiology.  Has a an ophthalmologist.    The ASCVD Risk score (Arnett DK, et al., 2019) failed to calculate for the following reasons:   The 2019 ASCVD risk score is only valid for ages 1 to 51  Health Maintenance Due  Topic Date Due   COVID-19 Vaccine (5 - 2023-24 season) 03/22/2022   INFLUENZA VACCINE  02/20/2023      Objective:     BP 133/78   Pulse 60   Ht 6\' 1"  (1.854 m)   Wt 207 lb (93.9 kg)   SpO2 95%   BMI 27.31 kg/m    Physical Exam General: Alert, oriented Pulmonary: No respiratory stress Psych: Pleasant affect   No results found for any visits on 03/06/23.      Assessment & Plan:   Gastroesophageal reflux disease, unspecified whether esophagitis present Assessment & Plan: Continue taking Nexium.  Patient states he is on 10 mg, reduced from 20 but it appears he reduced from 40 to 20.  Continue Nexium at 20 mg daily.   Elevated blood pressure reading Assessment & Plan: Initial reading was greater than 150 systolic.  Repeat was 133/78.  Patient does not want to take medications.  Continue to monitor.      Return in about 6 months (around  09/06/2023).    Sandre Kitty, MD

## 2023-03-06 NOTE — Assessment & Plan Note (Signed)
Continue taking Nexium.  Patient states he is on 10 mg, reduced from 20 but it appears he reduced from 40 to 20.  Continue Nexium at 20 mg daily.

## 2023-05-12 ENCOUNTER — Other Ambulatory Visit: Payer: Self-pay

## 2023-05-13 NOTE — Progress Notes (Signed)
Celso Amy, PA-C [address]  [address]  [place], [place] [address]  Main: [phone]  Fax: [phone]   Primary Care Physician: Sandre Kitty, MD  Primary Gastroenterologist:  Celso Amy, PA-C   CC: 58-month follow-up GERD, dysphagia and positive Cologuard  HPI: TAVARE FOLAN is a 82 y.o. male presents for 50-month follow-up of GERD.  Currently controlled on Nexium 20 mg once daily.  Patient has not had any more episodes of dysphagia, belching, or acid reflux since starting Nexium.  Barium swallow with tablet done 08/2022 showed severe GERD, small hiatal hernia, and Schatzki's ring at the GE junction with delayed passage of barium tablet.  He has adamantly declined to schedule EGD procedure.  I am concerned about risk of procedure at his age.  He is requesting to continue low-dose Nexium.  Previously had positive Cologuard test and a normal CEA blood level.  Patient has adamantly declined to schedule a colonoscopy.  He denies any GI symptoms such as diarrhea, constipation, rectal bleeding, or weight loss.  Labs 07/2022 showed normal CBC, CMP, and lipase.  Hemoglobin 15.6.  He feels great with no health concerns today.  Current Outpatient Medications  Medication Sig Dispense Refill   Cholecalciferol (VITAMIN D3) 2000 units capsule Take 1 capsule (2,000 Units total) by mouth daily.     COMIRNATY syringe      fluticasone-salmeterol (ADVAIR) 100-50 MCG/ACT AEPB Inhale 1 puff into the lungs 2 (two) times daily. 1 each 3   FLUZONE HIGH-DOSE 0.5 ML injection      loratadine (CLARITIN) 10 MG tablet Take 10 mg by mouth daily.     Vitamin D, Ergocalciferol, (DRISDOL) 1.25 MG (50000 UNIT) CAPS capsule Take 50,000 Units by mouth.     esomeprazole (NEXIUM) 20 MG capsule Take 1 capsule (20 mg total) by mouth daily at 12 noon. 90 capsule 3   No current facility-administered medications for this visit.    Allergies as of 05/14/2023 - Review Complete 05/14/2023  Allergen Reaction  Noted   Dog epithelium (canis lupus familiaris) Shortness Of Breath 02/24/2018   Cat hair extract  01/06/2018   Flovent hfa [fluticasone]  02/24/2018   Other  01/06/2018    Past Medical History:  Diagnosis Date   Allergy    pets & seasonal allergens    Past Surgical History:  Procedure Laterality Date   CATARACT EXTRACTION, BILATERAL     Dr Dione Booze   DUPUYTREN / PALMAR FASCIOTOMY     Dr Teressa Senter   EYE SURGERY N/A    [employer] 01/17/2020   HERNIA REPAIR     LUMBAR DISC SURGERY  1989   Dr Lynnette Caffey   TONSILLECTOMY      Review of Systems:    All systems reviewed and negative except where noted in HPI.   Physical Examination:   BP (!) 140/70   Pulse 86   Temp 97.7 F (36.5 C)   Ht 6\' 1"  (1.854 m)   Wt 211 lb 6.4 oz (95.9 kg)   BMI 27.89 kg/m   General: Well-nourished, well-developed in no acute distress.  Neuro: Alert and oriented x 3.  Grossly intact.  Psych: Alert and cooperative, normal mood and affect.   Imaging Studies: No results found.  Assessment and Plan:   STEPHAN STRANZ is a 72 y.o. y/o male returns for follow-up of:  1.  Acid reflux -controlled on Nexium 20 mg once daily.  He has not had any more episodes of GERD or dysphagia since  starting low-dose PPI.  Patient is requesting to continue Nexium.  Continue Nexium 20 mg once daily.  Continue avoiding GERD trigger foods.  2.  Positive Cologuard  Patient adamantly declines to schedule colonoscopy.  He has no GI symptoms.  He is worried about increased risk of colonoscopy procedure at his age.  He will let us know if he develops any GI symptoms.  Celso Amy, PA-C  Follow up in 1 year or as needed if he develops any GI symptoms.

## 2023-05-14 ENCOUNTER — Encounter: Payer: Self-pay | Admitting: Physician Assistant

## 2023-05-14 ENCOUNTER — Ambulatory Visit: Payer: PPO | Admitting: Physician Assistant

## 2023-05-14 VITALS — BP 140/70 | HR 86 | Temp 97.7°F | Ht 73.0 in | Wt 211.4 lb

## 2023-05-14 DIAGNOSIS — R195 Other fecal abnormalities: Secondary | ICD-10-CM

## 2023-05-14 DIAGNOSIS — K219 Gastro-esophageal reflux disease without esophagitis: Secondary | ICD-10-CM

## 2023-05-14 MED ORDER — ESOMEPRAZOLE MAGNESIUM 20 MG PO CPDR: 20.0000 mg | DELAYED_RELEASE_CAPSULE | Freq: Every day | ORAL | 3 refills | Status: DC

## 2023-05-21 DIAGNOSIS — N400 Enlarged prostate without lower urinary tract symptoms: Secondary | ICD-10-CM | POA: Diagnosis not present

## 2023-05-21 DIAGNOSIS — N139 Obstructive and reflux uropathy, unspecified: Secondary | ICD-10-CM | POA: Diagnosis not present

## 2023-05-21 DIAGNOSIS — R3914 Feeling of incomplete bladder emptying: Secondary | ICD-10-CM | POA: Diagnosis not present

## 2023-08-13 ENCOUNTER — Telehealth: Payer: Self-pay

## 2023-08-13 NOTE — Telephone Encounter (Signed)
Pt is coming in on 09/02/23 for FBW for his OV on 09/09/23. Pt is requesting to have a PSA ordered on that day.

## 2023-08-13 NOTE — Telephone Encounter (Signed)
The patient sees urology yearly, most recently last October 2024.  He has not had a PSA since 2020 because his PSA levels were declining back to the normal range.  At his age it is not recommended to routinely check PSA values.  I can talk to him about it further at his visit or he can talk to his urologist about if checking PSA values is still appropriate in his case.

## 2023-08-19 ENCOUNTER — Other Ambulatory Visit: Payer: Self-pay | Admitting: Family Medicine

## 2023-08-19 DIAGNOSIS — J4540 Moderate persistent asthma, uncomplicated: Secondary | ICD-10-CM

## 2023-08-19 MED ORDER — FLUTICASONE-SALMETEROL 100-50 MCG/ACT IN AEPB: 1.0000 | INHALATION_SPRAY | Freq: Two times a day (BID) | RESPIRATORY_TRACT | 1 refills | Status: AC

## 2023-08-21 ENCOUNTER — Other Ambulatory Visit: Payer: Self-pay

## 2023-08-21 DIAGNOSIS — E785 Hyperlipidemia, unspecified: Secondary | ICD-10-CM

## 2023-08-28 ENCOUNTER — Encounter: Payer: Self-pay | Admitting: Family Medicine

## 2023-09-02 ENCOUNTER — Other Ambulatory Visit: Payer: PPO

## 2023-09-02 DIAGNOSIS — E785 Hyperlipidemia, unspecified: Secondary | ICD-10-CM

## 2023-09-03 LAB — COMPREHENSIVE METABOLIC PANEL
ALT: 18 [IU]/L (ref 0–44)
AST: 20 [IU]/L (ref 0–40)
Albumin: 4.3 g/dL (ref 3.7–4.7)
Alkaline Phosphatase: 66 [IU]/L (ref 44–121)
BUN/Creatinine Ratio: 15 (ref 10–24)
BUN: 14 mg/dL (ref 8–27)
Bilirubin Total: 0.6 mg/dL (ref 0.0–1.2)
CO2: 24 mmol/L (ref 20–29)
Calcium: 9.1 mg/dL (ref 8.6–10.2)
Chloride: 105 mmol/L (ref 96–106)
Creatinine, Ser: 0.96 mg/dL (ref 0.76–1.27)
Globulin, Total: 2.4 g/dL (ref 1.5–4.5)
Glucose: 97 mg/dL (ref 70–99)
Potassium: 4.8 mmol/L (ref 3.5–5.2)
Sodium: 141 mmol/L (ref 134–144)
Total Protein: 6.7 g/dL (ref 6.0–8.5)
eGFR: 78 mL/min/{1.73_m2} (ref >59)

## 2023-09-03 LAB — CBC WITH DIFFERENTIAL/PLATELET
Basophils Absolute: 0 10*3/uL (ref 0.0–0.2)
Basos: 1 %
EOS (ABSOLUTE): 0.3 10*3/uL (ref 0.0–0.4)
Eos: 6 %
Hematocrit: 47.5 % (ref 37.5–51.0)
Hemoglobin: 15.9 g/dL (ref 13.0–17.7)
Immature Grans (Abs): 0 10*3/uL (ref 0.0–0.1)
Immature Granulocytes: 0 %
Lymphocytes Absolute: 1.2 10*3/uL (ref 0.7–3.1)
Lymphs: 22 %
MCH: 33.2 pg — ABNORMAL HIGH (ref 26.6–33.0)
MCHC: 33.5 g/dL (ref 31.5–35.7)
MCV: 99 fL — ABNORMAL HIGH (ref 79–97)
Monocytes Absolute: 0.6 10*3/uL (ref 0.1–0.9)
Monocytes: 12 %
Neutrophils Absolute: 3.1 10*3/uL (ref 1.4–7.0)
Neutrophils: 59 %
Platelets: 299 10*3/uL (ref 150–450)
RBC: 4.79 x10E6/uL (ref 4.14–5.80)
RDW: 13.2 % (ref 11.6–15.4)
WBC: 5.3 10*3/uL (ref 3.4–10.8)

## 2023-09-03 LAB — LIPID PANEL
Chol/HDL Ratio: 2.3 {ratio} (ref 0.0–5.0)
Cholesterol, Total: 225 mg/dL — ABNORMAL HIGH (ref 100–199)
HDL: 97 mg/dL (ref >39)
LDL Chol Calc (NIH): 112 mg/dL — ABNORMAL HIGH (ref 0–99)
Triglycerides: 92 mg/dL (ref 0–149)
VLDL Cholesterol Cal: 16 mg/dL (ref 5–40)

## 2023-09-03 LAB — HEMOGLOBIN A1C
Est. average glucose Bld gHb Est-mCnc: 131 mg/dL
Hgb A1c MFr Bld: 6.2 % — ABNORMAL HIGH (ref 4.8–5.6)

## 2023-09-08 ENCOUNTER — Ambulatory Visit: Payer: PPO | Admitting: Family Medicine

## 2023-09-09 ENCOUNTER — Ambulatory Visit (INDEPENDENT_AMBULATORY_CARE_PROVIDER_SITE_OTHER): Payer: PPO | Admitting: Family Medicine

## 2023-09-09 ENCOUNTER — Encounter: Payer: Self-pay | Admitting: Family Medicine

## 2023-09-09 VITALS — BP 116/66 | HR 67 | Ht 73.0 in | Wt 215.8 lb

## 2023-09-09 DIAGNOSIS — R351 Nocturia: Secondary | ICD-10-CM

## 2023-09-09 DIAGNOSIS — N401 Enlarged prostate with lower urinary tract symptoms: Secondary | ICD-10-CM | POA: Diagnosis not present

## 2023-09-09 DIAGNOSIS — J4540 Moderate persistent asthma, uncomplicated: Secondary | ICD-10-CM

## 2023-09-09 DIAGNOSIS — K219 Gastro-esophageal reflux disease without esophagitis: Secondary | ICD-10-CM

## 2023-09-09 NOTE — Assessment & Plan Note (Signed)
Still uses his Advair daily.  He no longer has the dogs that causes symptoms to worsen.  Continue Advair and loratadine.

## 2023-09-09 NOTE — Assessment & Plan Note (Signed)
Continue Nexium daily.  Continue dietary changes.

## 2023-09-09 NOTE — Assessment & Plan Note (Signed)
Not taking any medication for LUTS.  Follows with urology regularly.

## 2023-09-09 NOTE — Progress Notes (Signed)
   Established Patient Office Visit  Subjective   Patient ID: Jeremy Li, male    DOB: [birth year]  Age: 83 y.o. MRN: [id number]  Chief Complaint  Patient presents with   Medical Management of Chronic Issues    HPI Patient continues to take his Nexium for reflux.  He has also adjusted his diet.  He no longer eats out.  Eats 3 home-cooked meals a day.  Does not eat spicy food or [place] food anymore.  Patient still takes his Advair.  No issues with this.  Does 1 puff a day.  Patient continues to follow-up with his urologist, optometrist.  He has seen a cardiologist but was told he did not have any issues that needed further cardiology management  Patient is married.  Retired.  Does not use tobacco.  Drinks 1 glass of wine a day.  No recreational drugs.  Has a regular diet and does not exercise as much as he used to but tries to walk a few times a week.  Used to have 2 dogs and walked more often when he had them but now that he no longer has them he does not walk as often.  He keeps busy by collecting and watching 8 mm black and white film.   The ASCVD Risk score (Arnett DK, et al., 2019) failed to calculate for the following reasons:   The 2019 ASCVD risk score is only valid for ages 60 to 51  Health Maintenance Due  Topic Date Due   INFLUENZA VACCINE  02/20/2023   COVID-19 Vaccine (5 - 2024-25 season) 03/23/2023      Objective:     BP 116/66   Pulse 67   Ht 6\' 1"  (1.854 m)   Wt 215 lb 12.8 oz (97.9 kg)   SpO2 96%   BMI 28.47 kg/m    Physical Exam General: Alert, oriented CV: Regular rhythm Pulmonary: Lungs equal bilaterally   No results found for any visits on 09/09/23.      Assessment & Plan:   Gastroesophageal reflux disease, unspecified whether esophagitis present Assessment & Plan: Continue Nexium daily.  Continue dietary changes.   Moderate persistent reactive airway disease with wheezing without complication Assessment & Plan: Still uses his Advair  daily.  He no longer has the dogs that causes symptoms to worsen.  Continue Advair and loratadine.   Benign prostatic hyperplasia with nocturia Assessment & Plan: Not taking any medication for LUTS.  Follows with urology regularly.      Return in about 1 year (around 09/08/2024) for physical.    Sandre Kitty, MD

## 2023-09-09 NOTE — Patient Instructions (Addendum)
It was nice to see you today,  We addressed the following topics today: -We will see you again in a year from now for your next physical.  Prior to that you can repeat the labs we ordered this time - You should have a Medicare annual wellness visit over the phone sometime later this year.  Have a great day,  Frederic Jericho, MD

## 2023-10-10 ENCOUNTER — Telehealth: Payer: Self-pay | Admitting: Gastroenterology

## 2023-10-10 NOTE — Telephone Encounter (Signed)
 Good afternoon Dr. Tomasa Rand, I received a call from this patient requesting to transfer his care over to our practice due to Celso Amy leaving [facility]. Patient stated that he would like to continue his care over with Celso Amy here at [facility]. Would you please advise on to scheduling.    Thank you

## 2023-10-14 NOTE — Telephone Encounter (Signed)
 I called patient to inform him of Dr. Tomasa Rand recommendation regarding his transfer of care. Patient stated that he will call back to schedule an appointment for October.

## 2023-12-04 ENCOUNTER — Ambulatory Visit: Payer: PPO

## 2023-12-04 DIAGNOSIS — Z0000 Encounter for general adult medical examination without abnormal findings: Secondary | ICD-10-CM | POA: Diagnosis not present

## 2023-12-04 NOTE — Patient Instructions (Signed)
 Jeremy Li , Thank you for taking time out of your busy schedule to complete your Annual Wellness Visit with me. I enjoyed our conversation and look forward to speaking with you again next year. I, as well as your care team,  appreciate your ongoing commitment to your health goals. Please review the following plan we discussed and let me know if I can assist you in the future. Your Game plan/ To Do List    Referrals: If you haven't heard from the office you've been referred to, please reach out to them at the phone provided.  N/a Follow up Visits: Next Medicare AWV with our clinical staff: 12/16/2024 at 1:40   Have you seen your provider in the last 6 months (3 months if uncontrolled diabetes)? Yes Next Office Visit with your provider: 09/09/2024  Clinician Recommendations:  Aim for 30 minutes of exercise or brisk walking, 6-8 glasses of water, and 5 servings of fruits and vegetables each day.       This is a list of the screening recommended for you and due dates:  Health Maintenance  Topic Date Due   COVID-19 Vaccine (6 - Pfizer risk 2024-25 season) 10/07/2023   Flu Shot  02/20/2024   Medicare Annual Wellness Visit  12/03/2024   DTaP/Tdap/Td vaccine (3 - Td or Tdap) 01/07/2029   Pneumonia Vaccine  Completed   Zoster (Shingles) Vaccine  Completed   HPV Vaccine  Aged Out   Meningitis B Vaccine  Aged Out    Advanced directives: (Copy Requested) Please bring a copy of your health care power of attorney and living will to the office to be added to your chart at your convenience. You can mail to [facility] [address] [place], [place] [address] or email to [email or web address] Advance Care Planning is important because it:  [x]  Makes sure you receive the medical care that is consistent with your values, goals, and preferences  [x]  It provides guidance to your family and loved ones and reduces their decisional burden about whether or not they are making the right  decisions based on your wishes.  Follow the link provided in your after visit summary or read over the paperwork we have mailed to you to help you started getting your Advance Directives in place. If you need assistance in completing these, please reach out to us  so that we can help you!  See attachments for Preventive Care and Fall Prevention Tips.

## 2023-12-04 NOTE — Progress Notes (Signed)
 Subjective:   Jeremy Li is a 83 y.o. who presents for a Medicare Wellness preventive visit.  As a reminder, Annual Wellness Visits don't include a physical exam, and some assessments may be limited, especially if this visit is performed virtually. We may recommend an in-person visit if needed.  Visit Complete: Virtual I connected with  Jeremy Li on 12/04/23 by a audio enabled telemedicine application and verified that I am speaking with the correct person using two identifiers.  Patient Location: Home  Provider Location: Home Office  I discussed the limitations of evaluation and management by telemedicine. The patient expressed understanding and agreed to proceed.  Vital Signs: Because this visit was a virtual/telehealth visit, some criteria may be missing or patient reported. Any vitals not documented were not able to be obtained and vitals that have been documented are patient reported.  VideoError- Librarian, academic were attempted between this provider and patient, however failed, due to patient having technical difficulties OR patient did not have access to video capability.  We continued and completed visit with audio only.   Persons Participating in Visit: Patient.  AWV Questionnaire: No: Patient Medicare AWV questionnaire was not completed prior to this visit.  Cardiac Risk Factors include: advanced age (>46men, >29 women);dyslipidemia;male gender     Objective:     Today's Vitals   There is no height or weight on file to calculate BMI.     12/04/2023    1:41 PM 11/26/2022    1:40 PM 08/05/2017   11:09 AM  Advanced Directives  Does Patient Have a Medical Advance Directive? Yes Yes Yes  Type of Estate agent of [place];Living will Healthcare Power of [place];Living will Living will;Healthcare Power of Attorney  Copy of Healthcare Power of Attorney in Chart? No - copy requested No - copy requested No - copy  requested    Current Medications (verified) Outpatient Encounter Medications as of 12/04/2023  Medication Sig   Cholecalciferol (VITAMIN D3) 2000 units capsule Take 1 capsule (2,000 Units total) by mouth daily.   esomeprazole  (NEXIUM ) 20 MG capsule Take 1 capsule (20 mg total) by mouth daily at 12 noon.   fluticasone -salmeterol (ADVAIR) 100-50 MCG/ACT AEPB Inhale 1 puff into the lungs 2 (two) times daily.   loratadine  (CLARITIN ) 10 MG tablet Take 10 mg by mouth daily.   COMIRNATY syringe  (Patient not taking: Reported on 12/04/2023)   FLUZONE HIGH-DOSE 0.5 ML injection  (Patient not taking: Reported on 12/04/2023)   Vitamin D , Ergocalciferol , (DRISDOL ) 1.25 MG (50000 UNIT) CAPS capsule Take 50,000 Units by mouth. (Patient not taking: Reported on 09/09/2023)   No facility-administered encounter medications on file as of 12/04/2023.    Allergies (verified) Dog epithelium (canis lupus familiaris), Cat dander, Flovent  hfa [fluticasone ], and Other   History: Past Medical History:  Diagnosis Date   Allergy    pets & seasonal allergens   Past Surgical History:  Procedure Laterality Date   CATARACT EXTRACTION, BILATERAL     Dr Candi Chafe   DUPUYTREN / PALMAR FASCIOTOMY     Dr Lorena Rolling   EYE SURGERY N/A    [facility] 01/17/2020   HERNIA REPAIR     LUMBAR DISC SURGERY  1989   Dr Myrle Aspen   TONSILLECTOMY     Family History  Problem Relation Age of Onset   Heart disease Sister        cardiomegaly   COPD Neg Hx    Asthma Neg Hx  Cancer Neg Hx    Diabetes Neg Hx    Stroke Neg Hx    Social History   Socioeconomic History   Marital status: Married    Spouse name: Not on file   Number of children: 3   Years of education: 14   Highest education level: Associate degree: occupational, Scientist, product/process development, or vocational program  Occupational History   Occupation: Retired  Tobacco Use   Smoking status: Former    Current packs/day: 0.00    Average packs/day: 4.0 packs/day for 0.5 years (2.0 ttl  pk-yrs)    Types: Cigarettes    Start date: 01/20/1961    Quit date: 07/22/1961    Years since quitting: 62.4    Passive exposure: Never   Smokeless tobacco: Never   Tobacco comments:    smoked pipe 1-2 bowls/ day ages [age]  Vaping Use   Vaping status: Never Used  Substance and Sexual Activity   Alcohol use: Yes    Alcohol/week: 5.0 standard drinks of alcohol    Types: 5 Standard drinks or equivalent per week   Drug use: No   Sexual activity: Yes    Birth control/protection: None  Other Topics Concern   Not on file  Social History Narrative   Denies abuse and feels safe at home.    Social Drivers of Corporate investment banker Strain: Low Risk  (12/04/2023)   Overall Financial Resource Strain (CARDIA)    Difficulty of Paying Living Expenses: Not hard at all  Food Insecurity: No Food Insecurity (12/04/2023)   Hunger Vital Sign    Worried About Running Out of Food in the Last Year: Never true    Ran Out of Food in the Last Year: Never true  Transportation Needs: No Transportation Needs (12/04/2023)   PRAPARE - Administrator, Civil Service (Medical): No    Lack of Transportation (Non-Medical): No  Physical Activity: Insufficiently Active (12/04/2023)   Exercise Vital Sign    Days of Exercise per Week: 2 days    Minutes of Exercise per Session: 30 min  Stress: No Stress Concern Present (12/04/2023)   [employer] of Occupational Health - Occupational Stress Questionnaire    Feeling of Stress : Not at all  Social Connections: Moderately Integrated (12/04/2023)   Social Connection and Isolation Panel [NHANES]    Frequency of Communication with Friends and Family: More than three times a week    Frequency of Social Gatherings with Friends and Family: More than three times a week    Attends Religious Services: 1 to 4 times per year    Active Member of [employer] or Organizations: No    Attends Banker Meetings: Never    Marital Status: Married  Recent  Concern: Social Connections - Socially Isolated (09/05/2023)   Social Connection and Isolation Panel [NHANES]    Frequency of Communication with Friends and Family: Never    Frequency of Social Gatherings with Friends and Family: Never    Attends Religious Services: Never    Database administrator or Organizations: No    Attends Engineer, structural: Not on file    Marital Status: Married    Tobacco Counseling Counseling given: Not Answered Tobacco comments: smoked pipe 1-2 bowls/ day ages [age]    Clinical Intake:  Pre-visit preparation completed: Yes  Pain : No/denies pain     Nutritional Risks: None Diabetes: No  Lab Results  Component Value Date   HGBA1C 6.2 (H) 09/02/2023  HGBA1C 6.0 (H) 02/08/2022   HGBA1C 6.0 (H) 02/08/2021     How often do you need to have someone help you when you read instructions, pamphlets, or other written materials from your doctor or pharmacy?: 1 - Never  Interpreter Needed?: No  Information entered by :: NAllen LPN   Activities of Daily Living     12/02/2023    1:23 PM  In your present state of health, do you have any difficulty performing the following activities:  Hearing? 0  Vision? 0  Difficulty concentrating or making decisions? 0  Walking or climbing stairs? 0  Dressing or bathing? 0  Doing errands, shopping? 0  Preparing Food and eating ? N  Using the Toilet? N  In the past six months, have you accidently leaked urine? N  Do you have problems with loss of bowel control? N  Managing your Medications? N  Managing your Finances? N  Housekeeping or managing your Housekeeping? N    Patient Care Team: Laneta Pintos, MD as PCP - General (Family Medicine) Lyanne Sample, MD as Consulting Physician (Orthopedic Surgery) [place], Peter M, MD as Consulting Physician (Cardiology) Harlen Lick, MD as Consulting Physician (Dermatology) Christina Coyer, MD as Consulting Physician (Urology) Kathrin Pares, OD as  Referring Physician (Optometry)  Indicate any recent Medical Services you may have received from other than [facility] providers in the past year (date may be approximate).     Assessment:    This is a routine wellness examination for Jeremy Li.  Hearing/Vision screen Hearing Screening - Comments:: Denies hearing issues Vision Screening - Comments:: Regular eye exams, [facility]   Goals Addressed             This Visit's Progress    Patient Stated       12/04/2023, stay alive       Depression Screen     12/04/2023    1:43 PM 09/09/2023   11:01 AM 11/26/2022    1:38 PM 09/05/2022   11:23 AM 02/13/2022   10:14 AM 03/13/2021    3:15 PM 02/13/2021   10:18 AM  PHQ 2/9 Scores  PHQ - 2 Score 0 0 0 0 0 0 0  PHQ- 9 Score 0 0  0 0 0 0    Fall Risk     12/02/2023    1:23 PM 11/26/2022    1:39 PM 09/05/2022   11:24 AM 02/13/2022   10:14 AM 03/13/2021    3:15 PM  Fall Risk   Falls in the past year? 0 0 0 0 1  Number falls in past yr: 0 0 0 0 0  Injury with Fall? 0 0 0 0 0  Risk for fall due to : No Fall Risks No Fall Risks   Impaired mobility  Follow up Falls prevention discussed;Falls evaluation completed Falls prevention discussed  Falls evaluation completed Falls evaluation completed    MEDICARE RISK AT HOME:  Medicare Risk at Home Any stairs in or around the home?: (Patient-Rptd) No If so, are there any without handrails?: (Patient-Rptd) No Home free of loose throw rugs in walkways, pet beds, electrical cords, etc?: (Patient-Rptd) Yes Adequate lighting in your home to reduce risk of falls?: (Patient-Rptd) Yes Life alert?: (Patient-Rptd) No Use of a cane, walker or w/c?: (Patient-Rptd) No Grab bars in the bathroom?: (Patient-Rptd) Yes Shower chair or bench in shower?: (Patient-Rptd) Yes Elevated toilet seat or a handicapped toilet?: (Patient-Rptd) Yes  TIMED UP AND GO:  Was the test performed?  No  Cognitive Function: 6CIT completed        12/04/2023    1:47 PM 02/13/2022    10:15 AM 02/13/2021   10:05 AM 01/18/2020    1:13 PM 07/07/2018    1:29 PM  6CIT Screen  What Year? 0 points 0 points 0 points 0 points 0 points  What month? 0 points  0 points 0 points 0 points  What time? 0 points 0 points 0 points 0 points 0 points  Count back from 20 0 points 0 points 0 points 0 points 0 points  Months in reverse 0 points 0 points 0 points 0 points 0 points  Repeat phrase 0 points 0 points 0 points 0 points 0 points  Total Score 0 points  0 points 0 points 0 points    Immunizations Immunization History  Administered Date(s) Administered   Influenza Whole 05/25/2008, 05/22/2009, 04/21/2012   Influenza, High Dose Seasonal PF 04/21/2014, 05/29/2015, 04/23/2017, 04/29/2018, 05/10/2019   Influenza, Seasonal, Injecte, Preservative Fre 05/07/2013   Influenza,inj,Quad PF,6+ Mos 04/23/2017   Influenza-Unspecified 05/29/2015, 04/23/2017, 05/10/2018, 04/09/2023   PFIZER(Purple Top)SARS-COV-2 Vaccination 08/11/2019, 09/01/2019, 04/24/2020, 10/26/2020   PNEUMOCOCCAL CONJUGATE-20 06/09/2023   Pneumococcal Conjugate-13 04/21/2014   Pneumococcal Polysaccharide-23 05/29/2015   Respiratory Syncytial Virus Vaccine,Recomb Aduvanted(Arexvy) 04/28/2023   Td 09/27/2008   Tdap 01/08/2019   Unspecified SARS-COV-2 Vaccination 04/09/2023   Zoster Recombinant(Shingrix) 06/28/2017, 10/07/2017   Zoster, Live 03/29/2014    Screening Tests Health Maintenance  Topic Date Due   COVID-19 Vaccine (6 - Pfizer risk 2024-25 season) 10/07/2023   INFLUENZA VACCINE  02/20/2024   Medicare Annual Wellness (AWV)  12/03/2024   DTaP/Tdap/Td (3 - Td or Tdap) 01/07/2029   Pneumonia Vaccine 74+ Years old  Completed   Zoster Vaccines- Shingrix  Completed   HPV VACCINES  Aged Out   Meningococcal B Vaccine  Aged Out    Health Maintenance  Health Maintenance Due  Topic Date Due   COVID-19 Vaccine (6 - Pfizer risk 2024-25 season) 10/07/2023   Health Maintenance Items Addressed: Up to  date  Additional Screening:  Vision Screening: Recommended annual ophthalmology exams for early detection of glaucoma and other disorders of the eye.  Dental Screening: Recommended annual dental exams for proper oral hygiene  Community Resource Referral / Chronic Care Management: CRR required this visit?  No   CCM required this visit?  No   Plan:    I have personally reviewed and noted the following in the patient's chart:   Medical and social history Use of alcohol, tobacco or illicit drugs  Current medications and supplements including opioid prescriptions. Patient is not currently taking opioid prescriptions. Functional ability and status Nutritional status Physical activity Advanced directives List of other physicians Hospitalizations, surgeries, and ER visits in previous 12 months Vitals Screenings to include cognitive, depression, and falls Referrals and appointments  In addition, I have reviewed and discussed with patient certain preventive protocols, quality metrics, and best practice recommendations. A written personalized care plan for preventive services as well as general preventive health recommendations were provided to patient.   Areatha Beecham, LPN   1/61/0960   After Visit Summary: (MyChart) Due to this being a telephonic visit, the after visit summary with patients personalized plan was offered to patient via MyChart   Notes: Nothing significant to report at this time.

## 2023-12-16 DIAGNOSIS — H40013 Open angle with borderline findings, low risk, bilateral: Secondary | ICD-10-CM | POA: Diagnosis not present

## 2024-02-17 DIAGNOSIS — D3617 Benign neoplasm of peripheral nerves and autonomic nervous system of trunk, unspecified: Secondary | ICD-10-CM | POA: Diagnosis not present

## 2024-02-17 DIAGNOSIS — L821 Other seborrheic keratosis: Secondary | ICD-10-CM | POA: Diagnosis not present

## 2024-02-17 DIAGNOSIS — D1801 Hemangioma of skin and subcutaneous tissue: Secondary | ICD-10-CM | POA: Diagnosis not present

## 2024-02-17 DIAGNOSIS — L723 Sebaceous cyst: Secondary | ICD-10-CM | POA: Diagnosis not present

## 2024-02-17 DIAGNOSIS — L918 Other hypertrophic disorders of the skin: Secondary | ICD-10-CM | POA: Diagnosis not present

## 2024-03-23 ENCOUNTER — Other Ambulatory Visit: Payer: Self-pay

## 2024-03-23 ENCOUNTER — Encounter: Payer: Self-pay | Admitting: Physician Assistant

## 2024-03-23 DIAGNOSIS — K219 Gastro-esophageal reflux disease without esophagitis: Secondary | ICD-10-CM

## 2024-03-23 MED ORDER — ESOMEPRAZOLE MAGNESIUM 20 MG PO CPDR: 20.0000 mg | DELAYED_RELEASE_CAPSULE | Freq: Every day | ORAL | 3 refills | Status: DC

## 2024-05-12 ENCOUNTER — Encounter (INDEPENDENT_AMBULATORY_CARE_PROVIDER_SITE_OTHER): Payer: Self-pay

## 2024-05-12 ENCOUNTER — Encounter: Payer: Self-pay | Admitting: Physician Assistant

## 2024-05-12 ENCOUNTER — Ambulatory Visit: Admitting: Physician Assistant

## 2024-05-12 VITALS — BP 134/76 | HR 76 | Ht 73.0 in | Wt 216.2 lb

## 2024-05-12 DIAGNOSIS — K219 Gastro-esophageal reflux disease without esophagitis: Secondary | ICD-10-CM

## 2024-05-12 MED ORDER — ESOMEPRAZOLE MAGNESIUM 20 MG PO CPDR: 20.0000 mg | DELAYED_RELEASE_CAPSULE | Freq: Every day | ORAL | Status: DC

## 2024-05-12 NOTE — Progress Notes (Signed)
 Ellouise Console, PA-C [address] [place], [place]  [address] Phone: [phone]   Primary Care Physician: Chandra Toribio POUR, MD  Primary Gastroenterologist:  Ellouise Console, PA-C / Norleen Kiang, MD   Chief Complaint: Follow-up GERD.       HPI:   Discussed the use of AI scribe software for clinical note transcription with the patient, who gave verbal consent to proceed.  History of Present Illness Jeremy Li is an 83 year old male who presents for a follow-up regarding his GERD, Nexium  prescription, and overall health maintenance.  He takes Nexium  20 mg regularly, with the current prescription expiring soon. He plans to switch to an over-the-counter generic version. No current gastrointestinal symptoms, including indigestion, gas, heartburn, or changes in bowel movements. No blood in stools or urine.  He follows a Mediterranean diet, consuming chicken, pork, fish, fresh vegetables, and supports local businesses for dairy and bread products. He emphasizes eating fresh and healthy foods.  He reports having an umbilical hernia and has not pursued treatment due to the COVID-19 pandemic. No pain or swelling associated with the hernia.  He has regular check-ups with his primary care physician and is up to date with his labs and labs and vaccinations, including flu and COVID-19 shots.   GI history: I last saw him 04/2023 at [facility].  He was treated with Nexium  20 mg once daily with great benefit.  GERD symptoms resolved on low-dose Nexium .   Barium swallow with tablet done 08/2022 showed severe GERD, small hiatal hernia, and Schatzki's ring at the GE junction with delayed passage of barium tablet. He has adamantly declined to schedule EGD procedure due to advanced age and concerns about risk of procedure.  He is not having any solid food dysphagia since starting Nexium .  He had positive Cologuard test 09/2022 and a normal CEA blood level 11/2022.  Patient has adamantly declined to  schedule a colonoscopy due to his advanced age and fear of colon perforation.  He denies any GI symptoms such as diarrhea, constipation, rectal bleeding, or weight loss.   08/2023 labs: Normal WBC 5.3, Hgb 15.9, MCV 99.  A1c 6.2.  Normal CMP and LFTs.  No abdominal imaging, abdominal CT or ultrasound.  Current Outpatient Medications  Medication Sig Dispense Refill   Cholecalciferol (VITAMIN D3) 2000 units capsule Take 1 capsule (2,000 Units total) by mouth daily.     fluticasone -salmeterol (ADVAIR) 100-50 MCG/ACT AEPB Inhale 1 puff into the lungs 2 (two) times daily. 180 each 1   loratadine  (CLARITIN ) 10 MG tablet Take 10 mg by mouth daily.     Vitamin D , Ergocalciferol , (DRISDOL ) 1.25 MG (50000 UNIT) CAPS capsule Take 50,000 Units by mouth.     esomeprazole  (NEXIUM ) 20 MG capsule Take 1 capsule (20 mg total) by mouth daily at 12 noon.     No current facility-administered medications for this visit.    Allergies as of 05/12/2024 - Review Complete 05/12/2024  Allergen Reaction Noted   Dog epithelium (canis lupus familiaris) Shortness Of Breath 02/24/2018   Cat dander  01/06/2018   Flovent  hfa [fluticasone ]  02/24/2018   Other  01/06/2018    Past Medical History:  Diagnosis Date   Allergy    pets & seasonal allergens   Asthma     Past Surgical History:  Procedure Laterality Date   CATARACT EXTRACTION, BILATERAL     Dr Octavia   DUPUYTREN / PALMAR FASCIOTOMY     Dr Leonor  EYE SURGERY N/A    [facility] 01/17/2020   HERNIA REPAIR     LUMBAR DISC SURGERY  1989   Dr Hercules   TONSILLECTOMY      Review of Systems:    All systems reviewed and negative except where noted in HPI.    Physical Exam:  BP 134/76 (BP Location: Left Arm, Patient Position: Sitting, Cuff Size: Normal)   Pulse 76   Ht 6' 1 (1.854 m)   Wt 216 lb 4 oz (98.1 kg)   BMI 28.53 kg/m  No LMP for male patient.  General: Well-nourished, well-developed in no acute distress.  Lungs: Clear to auscultation  bilaterally. Non-labored. Heart: Regular rate and rhythm, no murmurs rubs or gallops.  Abdomen: Bowel sounds are normal; Abdomen is Soft and obese; No hepatosplenomegaly or masses.  Medium umbilical hernia which is reducible and nontender.  No Abdominal Tenderness; No guarding or rebound tenderness. Neuro: Alert and oriented x 3.  Grossly intact.  Psych: Alert and cooperative, normal mood and affect.   Imaging Studies: No results found.  Labs: CBC    Component Value Date/Time   WBC 5.3 09/02/2023 0827   WBC 7.7 08/09/2022 1642   RBC 4.79 09/02/2023 0827   RBC 4.68 08/09/2022 1642   HGB 15.9 09/02/2023 0827   HCT 47.5 09/02/2023 0827   PLT 299 09/02/2023 0827   MCV 99 (H) 09/02/2023 0827   MCH 33.2 (H) 09/02/2023 0827   MCH 33.3 08/09/2022 1642   MCHC 33.5 09/02/2023 0827   MCHC 32.7 08/09/2022 1642   RDW 13.2 09/02/2023 0827   LYMPHSABS 1.2 09/02/2023 0827   MONOABS 1.0 05/12/2015 1227   EOSABS 0.3 09/02/2023 0827   BASOSABS 0.0 09/02/2023 0827    CMP     Component Value Date/Time   NA 141 09/02/2023 0827   K 4.8 09/02/2023 0827   CL 105 09/02/2023 0827   CO2 24 09/02/2023 0827   GLUCOSE 97 09/02/2023 0827   GLUCOSE 113 (H) 08/09/2022 1642   BUN 14 09/02/2023 0827   CREATININE 0.96 09/02/2023 0827   CALCIUM 9.1 09/02/2023 0827   PROT 6.7 09/02/2023 0827   ALBUMIN 4.3 09/02/2023 0827   AST 20 09/02/2023 0827   ALT 18 09/02/2023 0827   ALKPHOS 66 09/02/2023 0827   BILITOT 0.6 09/02/2023 0827   GFRNONAA >60 08/09/2022 1642   GFRAA 95 01/27/2020 0905     Assessment and Plan:   KOLTAN PORTOCARRERO is a 83 y.o. y/o male returns for follow-up of:  1.  GERD: Controlled on Nexium  20 mg once daily - He will continue OTC Nexium  20 mg once daily. - Continue lifestyle modification and avoiding GERD trigger foods and drinks.   2.  Small hiatal hernia  3.  Schatzki's ring on barium swallow -Patient has adamantly declined EGD procedure with esophageal dilation due to  concerns about risk of procedure and his advanced age. - Dysphagia improved on low-dose Nexium  20 mg daily.  4.  Positive Cologuard - Patient has adamantly refused colonoscopy due to advanced age and concerned about colon perforation.  CEA lab was normal.  5.  Umbilical hernia without obstruction or gangrene Small umbilical hernia present. No pain or tenderness. He is Aware of surgical option if symptoms develop. - Monitor for abdominal pain or GI symptoms. Consider abdominal pelvic CT scan if symptoms develop such as abdominal pain or weight loss.  Patient declined abdominal pelvic CT today.   Ellouise Console, PA-C  Follow up as needed if  he develops any GI symptoms in the future.

## 2024-05-12 NOTE — Patient Instructions (Addendum)
 We have sent the following medications to your pharmacy for you to pick up at your convenience: Esomeprazole  20 mg once daily at noon  Please follow up sooner if symptoms increase or worsen  Due to recent changes in healthcare laws, you may see the results of your imaging and laboratory studies on MyChart before your provider has had a chance to review them.  We understand that in some cases there may be results that are confusing or concerning to you. Not all laboratory results come back in the same time frame and the provider may be waiting for multiple results in order to interpret others.  Please give us  48 hours in order for your provider to thoroughly review all the results before contacting the office for clarification of your results.   Thank you for trusting me with your gastrointestinal care!   Ellouise Console, PA-C _______________________________________________________  If your blood pressure at your visit was 140/90 or greater, please contact your primary care physician to follow up on this.  _______________________________________________________  If you are age 85 or older, your body mass index should be between 23-30. Your Body mass index is 28.53 kg/m. If this is out of the aforementioned range listed, please consider follow up with your Primary Care Provider.  If you are age 85 or younger, your body mass index should be between 19-25. Your Body mass index is 28.53 kg/m. If this is out of the aformentioned range listed, please consider follow up with your Primary Care Provider.   ________________________________________________________  The [facility] providers would like to encourage you to use MYCHART to communicate with providers for non-urgent requests or questions.  Due to long hold times on the telephone, sending your provider a message by [facility] may be a faster and more efficient way to get a response.  Please allow 48 business hours for a response.  Please remember that  this is for non-urgent requests.  _______________________________________________________

## 2024-05-14 NOTE — Progress Notes (Signed)
 Noted

## 2024-05-17 NOTE — Progress Notes (Deleted)
 Entered in error

## 2024-05-20 NOTE — Progress Notes (Deleted)
 Error

## 2024-05-27 NOTE — Progress Notes (Unsigned)
 N/a

## 2024-06-23 ENCOUNTER — Other Ambulatory Visit: Payer: Self-pay

## 2024-06-23 DIAGNOSIS — K219 Gastro-esophageal reflux disease without esophagitis: Secondary | ICD-10-CM

## 2024-06-23 MED ORDER — ESOMEPRAZOLE MAGNESIUM 20 MG PO CPDR: 20.0000 mg | DELAYED_RELEASE_CAPSULE | Freq: Every day | ORAL | 11 refills | Status: AC

## 2024-06-24 DIAGNOSIS — R3914 Feeling of incomplete bladder emptying: Secondary | ICD-10-CM | POA: Diagnosis not present

## 2024-06-24 DIAGNOSIS — R350 Frequency of micturition: Secondary | ICD-10-CM | POA: Diagnosis not present

## 2024-06-24 DIAGNOSIS — N390 Urinary tract infection, site not specified: Secondary | ICD-10-CM | POA: Diagnosis not present

## 2024-06-24 DIAGNOSIS — N401 Enlarged prostate with lower urinary tract symptoms: Secondary | ICD-10-CM | POA: Diagnosis not present

## 2024-07-26 ENCOUNTER — Ambulatory Visit: Admitting: Family Medicine

## 2024-07-26 ENCOUNTER — Encounter: Payer: Self-pay | Admitting: Family Medicine

## 2024-07-26 VITALS — BP 108/57 | HR 82 | Ht 73.0 in | Wt 205.8 lb

## 2024-07-26 DIAGNOSIS — R350 Frequency of micturition: Secondary | ICD-10-CM | POA: Diagnosis not present

## 2024-07-26 NOTE — Progress Notes (Signed)
" ° °  Established Patient Office Visit  Subjective   Patient ID: Jeremy Li, male    DOB: 09-Jun-1941  Age: 84 y.o. MRN: [id number]  Chief Complaint  Patient presents with   Urinary Frequency    History of Present Illness   Jeremy Li is an 84 year old male who presents with frequent urination and concerns about urinary symptoms.  He experienced a sudden onset of frequent urination beginning on December 4th, 2025, with nocturia occurring every hour, whereas previously he could sleep through the night without needing to urinate. There is no dysuria or discomfort during urination.  He was evaluated by a urologist at [facility] and was prescribed Flomax, which he understands is often used for an enlarged prostate. Despite taking Flomax, he continues to experience frequent urination without noticeable improvement. He has a 90-day supply of Flomax and is uncertain about its efficacy.  A urine sample taken during his urologist visit was initially dark yellow but has since become clear. An ultrasound of the bladder and kidneys was performed, but he has not received follow-up information regarding the results. Previous ultrasounds have shown residual urine in the bladder post-void.  No headache, vomiting, nausea, muscle weakness, or discomfort during urination. He has a history of a urinary tract infection but states that his current symptoms do not resemble those of a UTI.  He has a history of slightly elevated blood sugar levels but denies being diabetic or prediabetic. No excessive thirst or other symptoms typically associated with high blood sugar.  He is retired and has easy access to bathrooms at home. He has recently stopped drinking bourbon but continues to enjoy wine with dinner.          The ASCVD Risk score (Arnett DK, et al., 2019) failed to calculate for the following reasons:   The 2019 ASCVD risk score is only valid for ages 28 to 4   * - Cholesterol units were  assumed  There are no preventive care reminders to display for this patient.    Objective:     BP (!) 108/57   Pulse 82   Ht 6' 1 (1.854 m)   Wt 205 lb 12.8 oz (93.4 kg)   SpO2 90%   BMI 27.15 kg/m    Physical Exam     Gen: alert, oriented Pulm: no respiratory distress Psych: pleasant affect       No results found for any visits on 07/26/24.      Assessment & Plan:   Urinary frequency Assessment & Plan: Frequent urination, especially nocturia, with no pain. Urine sample showed mixed flora, not infection. Flomax ineffective, suggesting overactive bladder over enlarged prostate. Awaiting further evaluation before new medications. - Requested urology records, including labs and imaging. - Continue Flomax until further evaluation. - Discuss alternative medications if Flomax remains ineffective.          No follow-ups on file.    Toribio MARLA Slain, MD  "

## 2024-07-26 NOTE — Patient Instructions (Signed)
 It was nice to see you today,  We addressed the following topics today: - continue taking your flomax - I will request records from the urologist office.  - if there are any changes we need to make I will reach out to you.   Have a great day,  Rolan Slain, MD

## 2024-07-27 NOTE — Assessment & Plan Note (Signed)
 Frequent urination, especially nocturia, with no pain. Urine sample showed mixed flora, not infection. Flomax ineffective, suggesting overactive bladder over enlarged prostate. Awaiting further evaluation before new medications. - Requested urology records, including labs and imaging. - Continue Flomax until further evaluation. - Discuss alternative medications if Flomax remains ineffective.

## 2024-09-02 ENCOUNTER — Other Ambulatory Visit: Payer: PPO

## 2024-09-09 ENCOUNTER — Encounter: Payer: PPO | Admitting: Family Medicine

## 2024-12-16 ENCOUNTER — Ambulatory Visit
